# Patient Record
Sex: Female | Born: 1995 | Hispanic: Yes | Marital: Single | Smoking: Never smoker
Health system: Southern US, Community
[De-identification: ages and names within clinical notes are randomized; demographics above are authoritative.]

## PROBLEM LIST (undated history)

## (undated) DIAGNOSIS — D649 Anemia, unspecified: Secondary | ICD-10-CM

---
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
## 2016-01-06 ENCOUNTER — Ambulatory Visit (INDEPENDENT_AMBULATORY_CARE_PROVIDER_SITE_OTHER): Payer: Medicaid Other

## 2016-01-06 DIAGNOSIS — Z3402 Encounter for supervision of normal first pregnancy, second trimester: Secondary | ICD-10-CM | POA: Diagnosis not present

## 2016-01-08 ENCOUNTER — Other Ambulatory Visit: Payer: Self-pay | Admitting: Obstetrics and Gynecology

## 2016-01-11 ENCOUNTER — Encounter: Payer: Medicaid Other | Admitting: Obstetrics and Gynecology

## 2016-01-19 ENCOUNTER — Ambulatory Visit (INDEPENDENT_AMBULATORY_CARE_PROVIDER_SITE_OTHER): Payer: Medicaid Other | Admitting: Obstetrics and Gynecology

## 2016-01-19 VITALS — BP 105/67 | HR 96 | Wt 157.6 lb

## 2016-01-19 DIAGNOSIS — Z3492 Encounter for supervision of normal pregnancy, unspecified, second trimester: Secondary | ICD-10-CM

## 2016-01-19 LAB — POCT URINALYSIS DIPSTICK
Bilirubin, UA: NEGATIVE
Glucose, UA: NEGATIVE
Ketones, UA: NEGATIVE
Leukocytes, UA: NEGATIVE
NITRITE UA: NEGATIVE
PH UA: 6.5
PROTEIN UA: NEGATIVE
RBC UA: NEGATIVE
Spec Grav, UA: 1.015
UROBILINOGEN UA: 0.2

## 2016-01-19 NOTE — Progress Notes (Signed)
ROB-doing well, next visit glucola, encouraged enrolling in classes-schedule given

## 2016-01-19 NOTE — Progress Notes (Signed)
ROB- pt is doing well, denies any complaints 

## 2016-02-08 ENCOUNTER — Other Ambulatory Visit: Payer: Self-pay | Admitting: Obstetrics and Gynecology

## 2016-02-16 ENCOUNTER — Other Ambulatory Visit: Payer: Self-pay | Admitting: Obstetrics and Gynecology

## 2016-02-16 DIAGNOSIS — O219 Vomiting of pregnancy, unspecified: Secondary | ICD-10-CM

## 2016-02-21 ENCOUNTER — Other Ambulatory Visit: Payer: Medicaid Other

## 2016-02-21 ENCOUNTER — Ambulatory Visit (INDEPENDENT_AMBULATORY_CARE_PROVIDER_SITE_OTHER): Payer: Medicaid Other | Admitting: Obstetrics and Gynecology

## 2016-02-21 VITALS — BP 101/65 | HR 97 | Wt 169.3 lb

## 2016-02-21 DIAGNOSIS — Z3403 Encounter for supervision of normal first pregnancy, third trimester: Secondary | ICD-10-CM

## 2016-02-21 DIAGNOSIS — Z23 Encounter for immunization: Secondary | ICD-10-CM

## 2016-02-21 DIAGNOSIS — Z131 Encounter for screening for diabetes mellitus: Secondary | ICD-10-CM

## 2016-02-21 LAB — POCT URINALYSIS DIPSTICK
BILIRUBIN UA: NEGATIVE
Blood, UA: NEGATIVE
GLUCOSE UA: NEGATIVE
KETONES UA: NEGATIVE
Nitrite, UA: NEGATIVE
PROTEIN UA: NEGATIVE
SPEC GRAV UA: 1.02
Urobilinogen, UA: NEGATIVE
pH, UA: 6.5

## 2016-02-21 MED ORDER — TETANUS-DIPHTH-ACELL PERTUSSIS 5-2.5-18.5 LF-MCG/0.5 IM SUSP
0.5000 mL | Freq: Once | INTRAMUSCULAR | Status: AC
  Administered 2016-02-21: 0.5 mL via INTRAMUSCULAR

## 2016-02-21 NOTE — Progress Notes (Signed)
ROB: Doing well, denies complaints.  For 28 week labs today.  Desires to bottle feed.  Discussed benefits of breastfeeding. Unsure of contraceptive desires. For Tdap today, signed blood consent, discussed cord blood banking. Declines circumcision for female infant. Discussed pediatrician options for patient.

## 2016-02-22 LAB — HEMOGLOBIN AND HEMATOCRIT, BLOOD
HEMATOCRIT: 30.5 % — AB (ref 34.0–46.6)
Hemoglobin: 10.4 g/dL — ABNORMAL LOW (ref 11.1–15.9)

## 2016-02-22 LAB — GLUCOSE, 1 HOUR GESTATIONAL: Gestational Diabetes Screen: 100 mg/dL (ref 65–139)

## 2016-02-27 ENCOUNTER — Other Ambulatory Visit: Payer: Self-pay | Admitting: Obstetrics and Gynecology

## 2016-02-27 DIAGNOSIS — O219 Vomiting of pregnancy, unspecified: Secondary | ICD-10-CM

## 2016-03-06 ENCOUNTER — Ambulatory Visit (INDEPENDENT_AMBULATORY_CARE_PROVIDER_SITE_OTHER): Payer: Medicaid Other | Admitting: Obstetrics and Gynecology

## 2016-03-06 VITALS — BP 98/61 | HR 103 | Wt 172.2 lb

## 2016-03-06 DIAGNOSIS — O99013 Anemia complicating pregnancy, third trimester: Secondary | ICD-10-CM

## 2016-03-06 DIAGNOSIS — Z3403 Encounter for supervision of normal first pregnancy, third trimester: Secondary | ICD-10-CM

## 2016-03-06 LAB — POCT URINALYSIS DIPSTICK
Bilirubin, UA: NEGATIVE
Blood, UA: NEGATIVE
Glucose, UA: NEGATIVE
KETONES UA: NEGATIVE
Nitrite, UA: NEGATIVE
PH UA: 8.5
PROTEIN UA: NEGATIVE
SPEC GRAV UA: 1.01
UROBILINOGEN UA: 1

## 2016-03-06 MED ORDER — FERROUS SULFATE 325 (65 FE) MG PO TABS: 325.0000 mg | ORAL_TABLET | Freq: Every day | ORAL | 1 refills | Status: DC

## 2016-03-06 MED ORDER — DOCUSATE SODIUM 100 MG PO CAPS: 100.0000 mg | ORAL_CAPSULE | Freq: Two times a day (BID) | ORAL | 2 refills | Status: DC | PRN

## 2016-03-06 NOTE — Progress Notes (Signed)
ROB: Patient denies complaints. Normal glucola screen, mild anemia noted on labs.  Prescribed daily iron supplement, also prescribed Colace prn for constipation. Flu vaccine given last visit. RTC in 2 weeks.

## 2016-03-06 NOTE — Progress Notes (Signed)
ROB: Doing well, denies complaints. Desires OCPs for contraception.  Flu vaccine given last visit. Borderline anemia noted on 28 week labs, prescribed daily iron supplement.  Also prescribed Colace for mild constipation.  RTC in 2 weeks.

## 2016-03-20 ENCOUNTER — Ambulatory Visit (INDEPENDENT_AMBULATORY_CARE_PROVIDER_SITE_OTHER): Payer: Medicaid Other | Admitting: Obstetrics and Gynecology

## 2016-03-20 VITALS — BP 105/66 | HR 106 | Wt 176.9 lb

## 2016-03-20 DIAGNOSIS — Z3403 Encounter for supervision of normal first pregnancy, third trimester: Secondary | ICD-10-CM

## 2016-03-20 DIAGNOSIS — O2603 Excessive weight gain in pregnancy, third trimester: Secondary | ICD-10-CM

## 2016-03-20 DIAGNOSIS — O219 Vomiting of pregnancy, unspecified: Secondary | ICD-10-CM

## 2016-03-20 LAB — POCT URINALYSIS DIPSTICK
Bilirubin, UA: NEGATIVE
Glucose, UA: NEGATIVE
Ketones, UA: NEGATIVE
NITRITE UA: NEGATIVE
Protein, UA: NEGATIVE
RBC UA: NEGATIVE
SPEC GRAV UA: 1.015
UROBILINOGEN UA: NEGATIVE
pH, UA: 8

## 2016-03-20 MED ORDER — ONDANSETRON 4 MG PO TBDP: 4.0000 mg | ORAL_TABLET | Freq: Four times a day (QID) | ORAL | 1 refills | Status: DC | PRN

## 2016-03-20 NOTE — Progress Notes (Signed)
ROB: Patient still notes daily nausea and vomiting, desires refill on Zofran.  Discussed TWG (46 lbs), monitoring diet, calorie control. RTC in 2 weeks.

## 2016-04-03 ENCOUNTER — Ambulatory Visit (INDEPENDENT_AMBULATORY_CARE_PROVIDER_SITE_OTHER): Payer: Medicaid Other | Admitting: Obstetrics and Gynecology

## 2016-04-03 VITALS — BP 112/71 | HR 98 | Wt 179.8 lb

## 2016-04-03 DIAGNOSIS — Z3403 Encounter for supervision of normal first pregnancy, third trimester: Secondary | ICD-10-CM

## 2016-04-03 DIAGNOSIS — O2603 Excessive weight gain in pregnancy, third trimester: Secondary | ICD-10-CM

## 2016-04-03 LAB — POCT URINALYSIS DIPSTICK
Bilirubin, UA: NEGATIVE
Blood, UA: NEGATIVE
GLUCOSE UA: NEGATIVE
KETONES UA: NEGATIVE
Nitrite, UA: NEGATIVE
SPEC GRAV UA: 1.025
Urobilinogen, UA: NEGATIVE
pH, UA: 6

## 2016-04-03 NOTE — Progress Notes (Signed)
ROB: Denies complaints. Reiterated TWG (49 lbs), monitoring diet, calorie control. RTC in 2 weeks.

## 2016-04-17 ENCOUNTER — Ambulatory Visit (INDEPENDENT_AMBULATORY_CARE_PROVIDER_SITE_OTHER): Payer: Medicaid Other | Admitting: Obstetrics and Gynecology

## 2016-04-17 VITALS — BP 97/63 | HR 94 | Wt 184.8 lb

## 2016-04-17 DIAGNOSIS — Z3685 Encounter for antenatal screening for Streptococcus B: Secondary | ICD-10-CM

## 2016-04-17 DIAGNOSIS — O99013 Anemia complicating pregnancy, third trimester: Secondary | ICD-10-CM

## 2016-04-17 DIAGNOSIS — Z3403 Encounter for supervision of normal first pregnancy, third trimester: Secondary | ICD-10-CM

## 2016-04-17 DIAGNOSIS — Z113 Encounter for screening for infections with a predominantly sexual mode of transmission: Secondary | ICD-10-CM

## 2016-04-17 DIAGNOSIS — O2603 Excessive weight gain in pregnancy, third trimester: Secondary | ICD-10-CM

## 2016-04-17 LAB — POCT URINALYSIS DIPSTICK
BILIRUBIN UA: NEGATIVE
Glucose, UA: NEGATIVE
KETONES UA: NEGATIVE
Nitrite, UA: NEGATIVE
PH UA: 7
RBC UA: NEGATIVE
SPEC GRAV UA: 1.02
Urobilinogen, UA: NEGATIVE

## 2016-04-17 LAB — OB RESULTS CONSOLE GBS: GBS: NEGATIVE

## 2016-04-17 NOTE — Patient Instructions (Signed)
Labor precautions reviewed.  Call office or follow up at Labor and Delivery for the following: 1) Contractions less than 10 min apart for longer than 1 hour 2) Rupture of membranes 3) Vaginal bleeding requiring a pad 4) Decreased fetal movement 

## 2016-04-17 NOTE — Progress Notes (Signed)
ROB: Denies complaints. 36 week labs done today.  Discussed labor precautions. Reiterated weight gain precautions. RTC in 1 week.

## 2016-04-19 LAB — GC/CHLAMYDIA PROBE AMP
Chlamydia trachomatis, NAA: NEGATIVE
NEISSERIA GONORRHOEAE BY PCR: NEGATIVE

## 2016-04-21 LAB — CULTURE, BETA STREP (GROUP B ONLY): STREP GP B CULTURE: NEGATIVE

## 2016-04-26 ENCOUNTER — Ambulatory Visit (INDEPENDENT_AMBULATORY_CARE_PROVIDER_SITE_OTHER): Payer: Medicaid Other | Admitting: Obstetrics and Gynecology

## 2016-04-26 ENCOUNTER — Encounter: Payer: Medicaid Other | Admitting: Obstetrics and Gynecology

## 2016-04-26 VITALS — BP 115/69 | HR 109 | Wt 187.4 lb

## 2016-04-26 DIAGNOSIS — M791 Myalgia: Secondary | ICD-10-CM

## 2016-04-26 DIAGNOSIS — Z3403 Encounter for supervision of normal first pregnancy, third trimester: Secondary | ICD-10-CM

## 2016-04-26 DIAGNOSIS — M7918 Myalgia, other site: Secondary | ICD-10-CM

## 2016-04-26 LAB — POCT URINALYSIS DIPSTICK
Bilirubin, UA: NEGATIVE
GLUCOSE UA: NEGATIVE
Ketones, UA: NEGATIVE
Nitrite, UA: NEGATIVE
PH UA: 8
Spec Grav, UA: 1.01
UROBILINOGEN UA: NEGATIVE

## 2016-04-26 NOTE — Progress Notes (Signed)
ROB: C/o abdominal pain in upper left quadrant, musculoskeletal, likely where most fetal kicks felt.  Given reassurance, can use warm compress or Tylenol prn. Declines cervical exam today. GBS neg. RTC in 1 week.

## 2016-05-02 ENCOUNTER — Ambulatory Visit (INDEPENDENT_AMBULATORY_CARE_PROVIDER_SITE_OTHER): Payer: Medicaid Other | Admitting: Obstetrics and Gynecology

## 2016-05-02 VITALS — BP 94/67 | HR 117 | Wt 188.5 lb

## 2016-05-02 DIAGNOSIS — Z3493 Encounter for supervision of normal pregnancy, unspecified, third trimester: Secondary | ICD-10-CM

## 2016-05-02 LAB — POCT URINALYSIS DIPSTICK
Glucose, UA: NEGATIVE
KETONES UA: NEGATIVE
Nitrite, UA: NEGATIVE
PH UA: 7
RBC UA: NEGATIVE
SPEC GRAV UA: 1.015
Urobilinogen, UA: 0.2

## 2016-05-02 NOTE — Progress Notes (Signed)
ROB- pt is doing well, denies any complaints 

## 2016-05-02 NOTE — Progress Notes (Signed)
ROB- doing well with no complaints, labor precautions discussed

## 2016-05-09 ENCOUNTER — Ambulatory Visit (INDEPENDENT_AMBULATORY_CARE_PROVIDER_SITE_OTHER): Payer: Medicaid Other | Admitting: Obstetrics and Gynecology

## 2016-05-09 VITALS — BP 109/68 | HR 102 | Wt 191.0 lb

## 2016-05-09 DIAGNOSIS — R42 Dizziness and giddiness: Secondary | ICD-10-CM

## 2016-05-09 DIAGNOSIS — O2603 Excessive weight gain in pregnancy, third trimester: Secondary | ICD-10-CM

## 2016-05-09 DIAGNOSIS — O99013 Anemia complicating pregnancy, third trimester: Secondary | ICD-10-CM

## 2016-05-09 DIAGNOSIS — Z3403 Encounter for supervision of normal first pregnancy, third trimester: Secondary | ICD-10-CM

## 2016-05-09 LAB — POCT URINALYSIS DIPSTICK
BILIRUBIN UA: NEGATIVE
GLUCOSE UA: NEGATIVE
Ketones, UA: NEGATIVE
NITRITE UA: NEGATIVE
RBC UA: NEGATIVE
Spec Grav, UA: 1.015
UROBILINOGEN UA: NEGATIVE
pH, UA: 7.5

## 2016-05-09 NOTE — Progress Notes (Signed)
ROB: Patient notes intermittent ctx, not strong. Labor precautions reiterated. Also c/o dizziness over past few days, reports adequate hydration. BPs wnl, pulse mildly elevated. Has h/o anemia in pregnancy (mild), taking iron.  Will recheck Hgb.  RTC in 1 week if undelivered.

## 2016-05-16 ENCOUNTER — Ambulatory Visit (INDEPENDENT_AMBULATORY_CARE_PROVIDER_SITE_OTHER): Payer: Medicaid Other | Admitting: Obstetrics and Gynecology

## 2016-05-16 ENCOUNTER — Other Ambulatory Visit: Payer: Self-pay | Admitting: Obstetrics and Gynecology

## 2016-05-16 VITALS — BP 111/63 | HR 101 | Wt 192.2 lb

## 2016-05-16 DIAGNOSIS — Z3403 Encounter for supervision of normal first pregnancy, third trimester: Secondary | ICD-10-CM

## 2016-05-16 DIAGNOSIS — O2600 Excessive weight gain in pregnancy, unspecified trimester: Secondary | ICD-10-CM

## 2016-05-16 LAB — POCT URINALYSIS DIPSTICK
BILIRUBIN UA: NEGATIVE
GLUCOSE UA: NEGATIVE
Ketones, UA: NEGATIVE
NITRITE UA: NEGATIVE
Spec Grav, UA: 1.015
Urobilinogen, UA: NEGATIVE
pH, UA: 6.5

## 2016-05-16 NOTE — Progress Notes (Signed)
ROB: Doing well, no complaints. Still notes irregular mild ctx.  Desires IOL for post-dates.Discussed risks vs benefits.  Scheduled for 05/18/16 (to come in evening prior for cervical ripening).

## 2016-05-16 NOTE — Patient Instructions (Addendum)
Labor Induction Labor induction is when steps are taken to cause a pregnant woman to begin the labor process. Most women go into labor on their own between 37 weeks and 42 weeks of the pregnancy. When this does not happen or when there is a medical need, methods may be used to induce labor. Labor induction causes a pregnant woman's uterus to contract. It also causes the cervix to soften (ripen), open (dilate), and thin out (efface). Usually, labor is not induced before 39 weeks of the pregnancy unless there is a problem with the baby or mother. Before inducing labor, your health care provider will consider a number of factors, including the following:  The medical condition of you and the baby.  How many weeks along you are.  The status of the baby's lung maturity.  The condition of the cervix.  The position of the baby. What are the reasons for labor induction? Labor may be induced for the following reasons:  The health of the baby or mother is at risk.  The pregnancy is overdue by 1 week or more.  The water breaks but labor does not start on its own.  The mother has a health condition or serious illness, such as high blood pressure, infection, placental abruption, or diabetes.  The amniotic fluid amounts are low around the baby.  The baby is distressed. Convenience or wanting the baby to be born on a certain date is not a reason for inducing labor. What methods are used for labor induction? Several methods of labor induction may be used, such as:  Prostaglandin medicine. This medicine causes the cervix to dilate and ripen. The medicine will also start contractions. It can be taken by mouth or by inserting a suppository into the vagina.  Inserting a thin tube (catheter) with a balloon on the end into the vagina to dilate the cervix. Once inserted, the balloon is expanded with water, which causes the cervix to open.  Stripping the membranes. Your health care provider separates  amniotic sac tissue from the cervix, causing the cervix to be stretched and causing the release of a hormone called progesterone. This may cause the uterus to contract. It is often done during an office visit. You will be sent home to wait for the contractions to begin. You will then come in for an induction.  Breaking the water. Your health care provider makes a hole in the amniotic sac using a small instrument. Once the amniotic sac breaks, contractions should begin. This may still take hours to see an effect.  Medicine to trigger or strengthen contractions. This medicine is given through an IV access tube inserted into a vein in your arm. All of the methods of induction, besides stripping the membranes, will be done in the hospital. Induction is done in the hospital so that you and the baby can be carefully monitored. How long does it take for labor to be induced? Some inductions can take up to 2-3 days. Depending on the cervix, it usually takes less time. It takes longer when you are induced early in the pregnancy or if this is your first pregnancy. If a mother is still pregnant and the induction has been going on for 2-3 days, either the mother will be sent home or a cesarean delivery will be needed. What are the risks associated with labor induction? Some of the risks of induction include:  Changes in fetal heart rate, such as too high, too low, or erratic.  Fetal distress.    Chance of infection for the mother and baby.  Increased chance of having a cesarean delivery.  Breaking off (abruption) of the placenta from the uterus (rare).  Uterine rupture (very rare). When induction is needed for medical reasons, the benefits of induction may outweigh the risks. What are some reasons for not inducing labor? Labor induction should not be done if:  It is shown that your baby does not tolerate labor.  You have had previous surgeries on your uterus, such as a myomectomy or the removal of  fibroids.  Your placenta lies very low in the uterus and blocks the opening of the cervix (placenta previa).  Your baby is not in a head-down position.  The umbilical cord drops down into the birth canal in front of the baby. This could cut off the baby's blood and oxygen supply.  You have had a previous cesarean delivery.  There are unusual circumstances, such as the baby being extremely premature. This information is not intended to replace advice given to you by your health care provider. Make sure you discuss any questions you have with your health care provider. Document Released: 09/26/2006 Document Revised: 10/13/2015 Document Reviewed: 12/04/2012 Elsevier Interactive Patient Education  2017 [employer] Inc.  Pain Relief During Labor and Delivery Many things can cause pain during labor and delivery, including:  Pressure on bones and ligaments due to the baby moving through the pelvis.  Stretching of tissues due to the baby moving through the birth canal.  Muscle tension due to anxiety or nervousness.  The uterus tightening (contracting) and relaxing to help move the baby. There are many ways to deal with the pain of labor and delivery. They include:  Taking prenatal classes. Taking these classes helps you know what to expect during your baby's birth. What you learn will increase your confidence and decrease your anxiety.  Practicing relaxation techniques or doing relaxing activities, such as:  Focused breathing.  Meditation.  Visualization.  Aroma therapy.  Listening to your favorite music.  Hypnosis.  Taking a warm shower or bath (hydrotherapy). This may:  Provide comfort and relaxation.  Lessen your perception of pain.  Decrease the amount of pain medicine needed.  Decrease the length of labor.  Getting a massage or counterpressure on your back.  Applying warm packs or ice packs.  Changing positions often, moving around, or using a birthing  ball.  Getting:  Pain medicine through an IV or injection into a muscle.  Pain medicine inserted into your spinal column.  Injections of sterile water just under the skin on your lower back (intradermal injections).  Laughing gas (nitrous oxide). Discuss your pain control options with your health care provider during your prenatal visits. Explore the options offered by your hospital or birth center. What kinds of medicine are available? There are two kinds of medicines that can be used to relieve pain during labor and delivery:  Analgesics. These medicines decrease pain without causing you to lose feeling or the ability to move your muscles.  Anesthetics. These medicines block feeling in the body and can decrease your ability to move freely. Both of these kinds of medicine can cause minor side effects, such as nausea, trouble concentrating, and sleepiness. They can also decrease the baby's heart rate before birth and affect the baby's breathing rate after birth. For this reason, health care providers are careful about when and how much medicine is given. What are specific medicines and procedures that provide pain relief?  Local Anesthetics  Local anesthetics  are used to numb a small area of the body. They may be used along with another kind of anesthetic or used to numb the nerves of the vagina, cervix, and perineum during the second stage of labor. General Anesthetics  General anesthetics cause you to lose consciousness so you do not feel pain. They are usually only used for an emergency cesarean delivery. General anesthetics are given through an IV tube and a mask. Pudendal Block  A pudendal block is a form of local anesthetic. It may be used to relieve the pain associated with pushing or stretching of the perineum at the time of delivery or to further numb the perineum. A pudendal block is done by injecting numbing medicine through the vaginal wall into a nerve in the pelvis. Epidural  Analgesia  Epidural analgesia is given through a flexible IV catheter that is inserted into the lower back. Numbing medicine is delivered continuously to the area near your spinal column nerves (epidural space). After having this type of analgesia, you may be able to move your legs but you most likely will not be able to walk. Depending on the amount of medicine given, you may lose all feeling in the lower half of your body, or you may retain some level of sensation, including the urge to push. Epidural analgesia can be used to provide pain relief for a vaginal birth. Spinal Block  A spinal block is similar to epidural analgesia, but the medicine is injected into the spinal fluid instead of the epidural space. A spinal block is only given once. It starts to relieve pain quickly, but the pain relief lasts only 1-6 hours. Spinal blocks can be used for cesarean deliveries. Combined Spinal-Epidural (CSE) Block  A CSE block combines the effects of a spinal block and epidural analgesia. The spinal block works quickly to block all pain. The epidural analgesia provides continuous pain relief, even after the effects of the spinal block have worn off. This information is not intended to replace advice given to you by your health care provider. Make sure you discuss any questions you have with your health care provider. Document Released: 08/23/2008 Document Revised: 10/14/2015 Document Reviewed: 09/28/2015 Elsevier Interactive Patient Education  2017 [employer]

## 2016-05-17 ENCOUNTER — Telehealth: Payer: Self-pay | Admitting: Obstetrics and Gynecology

## 2016-05-17 NOTE — Telephone Encounter (Signed)
Contacted patient, left voicemail regarding rescheduling of elective induction.  Patient currently scheduled for this evening at 9 pm, however will need to be moved to tomorrow morning at 8 am due to staffing and full Labor and Delivery.  Advised that this was tentative, and that this also may change.  Patient instructed to call the office if she has any questions.    Hildred LaserAnika Sania Noy, MD [facility]

## 2016-05-18 ENCOUNTER — Inpatient Hospital Stay
Admission: EM | Admit: 2016-05-18 | Discharge: 2016-05-20 | DRG: 775 | Disposition: A | Discharge status: Home / Self Care | Payer: Medicaid Other | Attending: Obstetrics and Gynecology | Admitting: Obstetrics and Gynecology

## 2016-05-18 ENCOUNTER — Other Ambulatory Visit: Payer: Self-pay | Admitting: Obstetrics and Gynecology

## 2016-05-18 ENCOUNTER — Ambulatory Visit (INDEPENDENT_AMBULATORY_CARE_PROVIDER_SITE_OTHER): Payer: Medicaid Other | Admitting: Obstetrics and Gynecology

## 2016-05-18 ENCOUNTER — Ambulatory Visit (INDEPENDENT_AMBULATORY_CARE_PROVIDER_SITE_OTHER): Payer: Medicaid Other

## 2016-05-18 ENCOUNTER — Encounter: Payer: Self-pay | Admitting: Anesthesiology

## 2016-05-18 VITALS — BP 131/64 | HR 110 | Wt 193.7 lb

## 2016-05-18 DIAGNOSIS — O9081 Anemia of the puerperium: Secondary | ICD-10-CM | POA: Diagnosis not present

## 2016-05-18 DIAGNOSIS — D649 Anemia, unspecified: Secondary | ICD-10-CM | POA: Diagnosis not present

## 2016-05-18 DIAGNOSIS — D72829 Elevated white blood cell count, unspecified: Secondary | ICD-10-CM | POA: Diagnosis not present

## 2016-05-18 DIAGNOSIS — Z3A40 40 weeks gestation of pregnancy: Secondary | ICD-10-CM

## 2016-05-18 DIAGNOSIS — Z833 Family history of diabetes mellitus: Secondary | ICD-10-CM | POA: Diagnosis not present

## 2016-05-18 DIAGNOSIS — O99013 Anemia complicating pregnancy, third trimester: Secondary | ICD-10-CM

## 2016-05-18 DIAGNOSIS — O4202 Full-term premature rupture of membranes, onset of labor within 24 hours of rupture: Secondary | ICD-10-CM | POA: Diagnosis present

## 2016-05-18 DIAGNOSIS — O480 Post-term pregnancy: Secondary | ICD-10-CM

## 2016-05-18 DIAGNOSIS — O479 False labor, unspecified: Secondary | ICD-10-CM | POA: Diagnosis present

## 2016-05-18 DIAGNOSIS — Z3403 Encounter for supervision of normal first pregnancy, third trimester: Secondary | ICD-10-CM | POA: Diagnosis not present

## 2016-05-18 LAB — POCT URINALYSIS DIPSTICK
Bilirubin, UA: NEGATIVE
GLUCOSE UA: NEGATIVE
KETONES UA: NEGATIVE
Leukocytes, UA: NEGATIVE
Nitrite, UA: NEGATIVE
UROBILINOGEN UA: NEGATIVE
pH, UA: 6

## 2016-05-18 LAB — TYPE AND SCREEN
ABO/RH(D): O POS
Antibody Screen: NEGATIVE

## 2016-05-18 LAB — CBC
HCT: 33.7 % — ABNORMAL LOW (ref 35.0–47.0)
Hemoglobin: 11.1 g/dL — ABNORMAL LOW (ref 12.0–16.0)
MCH: 29 pg (ref 26.0–34.0)
MCHC: 32.9 g/dL (ref 32.0–36.0)
MCV: 88.2 fL (ref 80.0–100.0)
PLATELETS: 238 10*3/uL (ref 150–440)
RBC: 3.82 MIL/uL (ref 3.80–5.20)
RDW: 16.6 % — AB (ref 11.5–14.5)
WBC: 13.5 10*3/uL — ABNORMAL HIGH (ref 3.6–11.0)

## 2016-05-18 MED ORDER — LIDOCAINE HCL (PF) 1 % IJ SOLN
30.0000 mL | INTRAMUSCULAR | Status: DC | PRN
  Administered 2016-05-19: 30 mL via SUBCUTANEOUS

## 2016-05-18 MED ORDER — BUTORPHANOL TARTRATE 1 MG/ML IJ SOLN
1.0000 mg | INTRAMUSCULAR | Status: DC | PRN
  Administered 2016-05-18 (×2): 1 mg via INTRAVENOUS
  Filled 2016-05-18 (×2): qty 1

## 2016-05-18 MED ORDER — FENTANYL 2.5 MCG/ML W/ROPIVACAINE 0.2% IN NS 100 ML EPIDURAL INFUSION (ARMC-ANES)
EPIDURAL | Status: AC
  Filled 2016-05-18: qty 100

## 2016-05-18 MED ORDER — LIDOCAINE HCL (PF) 1 % IJ SOLN
INTRAMUSCULAR | Status: AC
  Administered 2016-05-19: 30 mL via SUBCUTANEOUS
  Filled 2016-05-18: qty 30

## 2016-05-18 MED ORDER — LACTATED RINGERS IV SOLN: 500.0000 mL | INTRAVENOUS | Status: DC | PRN

## 2016-05-18 MED ORDER — ONDANSETRON HCL 4 MG/2ML IJ SOLN: 4.0000 mg | Freq: Four times a day (QID) | INTRAMUSCULAR | Status: DC | PRN

## 2016-05-18 MED ORDER — AMMONIA AROMATIC IN INHA
RESPIRATORY_TRACT | Status: AC
  Filled 2016-05-18: qty 10

## 2016-05-18 MED ORDER — OXYCODONE-ACETAMINOPHEN 5-325 MG PO TABS
2.0000 | ORAL_TABLET | ORAL | Status: DC | PRN
  Administered 2016-05-19: 2 via ORAL
  Filled 2016-05-18: qty 2

## 2016-05-18 MED ORDER — BUTORPHANOL TARTRATE 1 MG/ML IJ SOLN: 2.0000 mg | INTRAMUSCULAR | Status: DC | PRN

## 2016-05-18 MED ORDER — OXYTOCIN 40 UNITS IN LACTATED RINGERS INFUSION - SIMPLE MED
2.5000 [IU]/h | INTRAVENOUS | Status: DC
  Filled 2016-05-18: qty 1000

## 2016-05-18 MED ORDER — OXYCODONE-ACETAMINOPHEN 5-325 MG PO TABS: 1.0000 | ORAL_TABLET | ORAL | Status: DC | PRN

## 2016-05-18 MED ORDER — LACTATED RINGERS IV SOLN
INTRAVENOUS | Status: DC
  Administered 2016-05-18: 21:00:00 via INTRAVENOUS

## 2016-05-18 MED ORDER — ACETAMINOPHEN 325 MG PO TABS: 650.0000 mg | ORAL_TABLET | ORAL | Status: DC | PRN

## 2016-05-18 MED ORDER — MISOPROSTOL 200 MCG PO TABS
ORAL_TABLET | ORAL | Status: AC
  Filled 2016-05-18: qty 4

## 2016-05-18 MED ORDER — SOD CITRATE-CITRIC ACID 500-334 MG/5ML PO SOLN: 30.0000 mL | ORAL | Status: DC | PRN

## 2016-05-18 MED ORDER — OXYTOCIN 40 UNITS IN LACTATED RINGERS INFUSION - SIMPLE MED: 1.0000 m[IU]/min | INTRAVENOUS | Status: DC

## 2016-05-18 MED ORDER — TERBUTALINE SULFATE 1 MG/ML IJ SOLN: 0.2500 mg | Freq: Once | INTRAMUSCULAR | Status: DC | PRN

## 2016-05-18 MED ORDER — OXYTOCIN 10 UNIT/ML IJ SOLN
INTRAMUSCULAR | Status: AC
  Administered 2016-05-18: 20 [IU]
  Filled 2016-05-18: qty 2

## 2016-05-18 MED ORDER — MISOPROSTOL 25 MCG QUARTER TABLET: 25.0000 ug | ORAL_TABLET | ORAL | Status: DC | PRN

## 2016-05-18 MED ORDER — OXYTOCIN BOLUS FROM INFUSION: 500.0000 mL | Freq: Once | INTRAVENOUS | Status: DC

## 2016-05-18 NOTE — H&P (Signed)
Obstetric History and Physical  April Norris is a 20 y.o. G1P0 with IUP at 6872w4d presenting for ruptured membranes.  Patient states she has been having  regular, every 3-4 minutes contractions, none vaginal bleeding, ruptured, clear fluid membranes since 8:00 p.m., with active fetal movement.    Of note, patient was scheduled for elective IOL this evening for post-dates pregnancy.   Prenatal Course Source of Care: [facility] with onset of care at 8 weeks Pregnancy complications or risks: Patient Active Problem List   Diagnosis Date Noted  . Uterine contractions during pregnancy 05/18/2016  . Excessive weight gain during pregnancy in third trimester 03/20/2016  . Nausea and vomiting during pregnancy 10/26/2015  . Supervision of normal first teen pregnancy, third trimester 10/26/2015   She plans to bottle feed She desires oral contraceptives (estrogen/progesterone) for postpartum contraception.   Prenatal labs and studies: ABO, Rh: --/--/O POS (12/29 2059) Antibody: NEG (12/29 2059) Rubella: 1.42 (06/09 1108) RPR: Non Reactive (05/18 1027)  HBsAg: Negative (05/18 1027)  HIV: Non Reactive (05/18 1027)  ZOX:WRUEAVWUGBS:Negative (11/28 1020) 1 hr Glucola  Normal (100) Genetic screening normal Anatomy US normal   Past Medical History:  Diagnosis Date  . Anemia     Past Surgical History:  Procedure Laterality Date  . GANGLION CYST EXCISION Left 10/11/2014   Procedure: REMOVAL GANGLION OF WRIST;  Surgeon: Deeann SaintHoward Miller, MD;  Location: [facility] ORS;  Service: Orthopedics;  Laterality: Left;    OB History  Gravida Para Term Preterm AB Living  1            SAB TAB Ectopic Multiple Live Births               # Outcome Date GA Lbr Len/2nd Weight Sex Delivery Anes PTL Lv  1 Current               Social History   Social History  . Marital status: Single    Spouse name: N/A  . Number of children: N/A  . Years of education: N/A   Occupational History  . student    Social  History Main Topics  . Smoking status: Never Smoker  . Smokeless tobacco: Never Used  . Alcohol use No  . Drug use: No  . Sexual activity: Yes    Partners: Male   Other Topics Concern  . None   Social History Narrative  . None    Family History  Problem Relation Age of Onset  . Diabetes Maternal Grandmother   . Hyperlipidemia Maternal Grandmother   . Diabetes Paternal Grandmother   . Cancer Paternal Grandfather     ?stomach related    Prescriptions Prior to Admission  Medication Sig Dispense Refill Last Dose  . ferrous sulfate (FERROUSUL) 325 (65 FE) MG tablet Take 1 tablet (325 mg total) by mouth daily with breakfast. 60 tablet 1 05/17/2016 at Unknown time  . Prenatal Vit-Fe Fumarate-FA (PRENATAL MULTIVITAMIN) TABS tablet Take 1 tablet by mouth daily at 12 noon.   05/17/2016 at Unknown time    No Known Allergies  Review of Systems: Negative except for what is mentioned in HPI.  Physical Exam: BP 116/77 (BP Location: Left Arm)   Pulse 99   Temp 98.3 F (36.8 C) (Oral)   Resp 18   Ht 5\' 3"  (1.6 m)   Wt 193 lb (87.5 kg)   LMP 08/08/2015 (Exact Date)   BMI 34.19 kg/m  CONSTITUTIONAL: Well-developed, well-nourished female in no mild to moderate distress with  contractions.  HENT:  Normocephalic, atraumatic, External right and left ear normal. Oropharynx is clear and moist EYES: Conjunctivae and EOM are normal. Pupils are equal, round, and reactive to light. No scleral icterus.  NECK: Normal range of motion, supple, no masses SKIN: Skin is warm and dry. No rash noted. Not diaphoretic. No erythema. No pallor. NEUROLOGIC: Alert and oriented to person, place, and time. Normal reflexes, muscle tone coordination. No cranial nerve deficit noted. PSYCHIATRIC: Normal mood and affect. Normal behavior. Normal judgment and thought content. CARDIOVASCULAR: Normal heart rate noted, regular rhythm RESPIRATORY: Effort and breath sounds normal, no problems with respiration  noted ABDOMEN: Soft, nontender, nondistended, gravid. MUSCULOSKELETAL: Normal range of motion. No edema and no tenderness. 2+ distal pulses.  Cervical Exam: Dilatation 3 cm   Effacement 80%   Station -1   Presentation: cephalic FHT:  Baseline rate 148 bpm   Variability moderate  Accelerations present   Decelerations none Contractions: Every 3 mins   Pertinent Labs/Studies:   Results for orders placed or performed during the hospital encounter of 05/18/16 (from the past 24 hour(s))  CBC     Status: Abnormal   Collection Time: 05/18/16  8:59 PM  Result Value Ref Range   WBC 13.5 (H) 3.6 - 11.0 K/uL   RBC 3.82 3.80 - 5.20 MIL/uL   Hemoglobin 11.1 (L) 12.0 - 16.0 g/dL   HCT 40.933.7 (L) 81.135.0 - 91.447.0 %   MCV 88.2 80.0 - 100.0 fL   MCH 29.0 26.0 - 34.0 pg   MCHC 32.9 32.0 - 36.0 g/dL   RDW 78.216.6 (H) 95.611.5 - 21.314.5 %   Platelets 238 150 - 440 K/uL  Type and screen     Status: None   Collection Time: 05/18/16  8:59 PM  Result Value Ref Range   ABO/RH(D) O POS    Antibody Screen NEG    Sample Expiration 05/21/2016     Assessment : April Norris is a 20 y.o. G1P0 at 4470w4d being admitted for early labor with SROM.  Plan: Labor: Expectant management. Augmentation as needed, per protocol.  Desires to labor with IV meds only at this time.  FWB: Reassuring fetal heart tracing.  GBS negative Delivery plan: Hopeful for vaginal delivery.  Hildred LaserAnika Soledad Budreau, MD [facility]

## 2016-05-18 NOTE — Progress Notes (Signed)
NONSTRESS TEST INTERPRETATION  INDICATIONS: Post Dates  FHR baseline: 140bpm RESULTS:Reactive COMMENTS: Pt for induction tonight, also growth and AFI today    PLAN: 1. Continue fetal kick counts twice a day. 2. Continue antepartum testing as scheduled-Biweekly 3. Go to ER tonight for induction   April Norris, CMA

## 2016-05-18 NOTE — Patient Instructions (Signed)
Nonstress Test The nonstress test is a procedure that monitors the fetus's heartbeat. The test will monitor the heartbeat when the fetus is at rest and while the fetus is moving. In a healthy fetus, there will be an increase in fetal heart rate when the fetus moves or kicks. The heart rate will decrease at rest. This test helps determine if the fetus is healthy. Your health care provider will look at a number of patterns in the heart rate tracing to make sure your baby is thriving. If there is concern, your health care provider may order additional tests or may suggest another course of action. This test is often done in the third trimester and can help determine if an early delivery is needed and safe. Common reasons to have this test are:  You are past your due date.  You have a high-risk pregnancy.  You are feeling less movement than normal.  You have lost a pregnancy in the past.  Your health care provider suspects fetal growth problems.  You have too much or too little amniotic fluid. BEFORE THE PROCEDURE  Eat a meal right before the test or as directed by your health care provider. Food may help stimulate fetal movements.  Use the restroom right before the test. PROCEDURE  Two belts will be placed around your abdomen. These belts have monitors attached to them. One records the fetal heart rate and the other records uterine contractions.  You may be asked to lie down on your side or to stay sitting upright.  You may be given a button to press when you feel movement.  The fetal heartbeat is listened to and watched on a screen. The heartbeat is recorded on a sheet of paper.  If the fetus seems to be sleeping, you may be asked to drink some juice or soda, gently press your abdomen, or make some noise to wake the fetus. AFTER THE PROCEDURE  Your health care provider will discuss the test results with you and make recommendations for the near future. This information is not intended  to replace advice given to you by your health care provider. Make sure you discuss any questions you have with your health care provider. Document Released: 04/27/2002 Document Revised: 05/28/2014 Document Reviewed: 06/10/2012 Elsevier Interactive Patient Education  2017 Elsevier Inc.  

## 2016-05-19 DIAGNOSIS — Z3403 Encounter for supervision of normal first pregnancy, third trimester: Secondary | ICD-10-CM

## 2016-05-19 LAB — CBC
HCT: 27.2 % — ABNORMAL LOW (ref 35.0–47.0)
HEMOGLOBIN: 9 g/dL — AB (ref 12.0–16.0)
MCH: 28.9 pg (ref 26.0–34.0)
MCHC: 33 g/dL (ref 32.0–36.0)
MCV: 87.8 fL (ref 80.0–100.0)
PLATELETS: 225 10*3/uL (ref 150–440)
RBC: 3.1 MIL/uL — AB (ref 3.80–5.20)
RDW: 16.6 % — ABNORMAL HIGH (ref 11.5–14.5)
WBC: 19.6 10*3/uL — AB (ref 3.6–11.0)

## 2016-05-19 MED ORDER — SENNOSIDES-DOCUSATE SODIUM 8.6-50 MG PO TABS
2.0000 | ORAL_TABLET | ORAL | Status: DC
  Administered 2016-05-20: 2 via ORAL
  Filled 2016-05-19: qty 2

## 2016-05-19 MED ORDER — ZOLPIDEM TARTRATE 5 MG PO TABS: 5.0000 mg | ORAL_TABLET | Freq: Every evening | ORAL | Status: DC | PRN

## 2016-05-19 MED ORDER — IBUPROFEN 600 MG PO TABS
600.0000 mg | ORAL_TABLET | Freq: Four times a day (QID) | ORAL | Status: DC
  Administered 2016-05-19: 600 mg via ORAL
  Filled 2016-05-19 (×2): qty 1

## 2016-05-19 MED ORDER — PRENATAL MULTIVITAMIN CH
1.0000 | ORAL_TABLET | Freq: Every day | ORAL | Status: DC
  Administered 2016-05-19 – 2016-05-20 (×2): 1 via ORAL
  Filled 2016-05-19 (×2): qty 1

## 2016-05-19 MED ORDER — IBUPROFEN 600 MG PO TABS
600.0000 mg | ORAL_TABLET | Freq: Four times a day (QID) | ORAL | Status: DC
  Administered 2016-05-19 – 2016-05-20 (×4): 600 mg via ORAL
  Filled 2016-05-19 (×4): qty 1

## 2016-05-19 MED ORDER — SIMETHICONE 80 MG PO CHEW: 80.0000 mg | CHEWABLE_TABLET | ORAL | Status: DC | PRN

## 2016-05-19 MED ORDER — BENZOCAINE-MENTHOL 20-0.5 % EX AERO: 1.0000 "application " | INHALATION_SPRAY | CUTANEOUS | Status: DC | PRN

## 2016-05-19 MED ORDER — TETANUS-DIPHTH-ACELL PERTUSSIS 5-2.5-18.5 LF-MCG/0.5 IM SUSP: 0.5000 mL | Freq: Once | INTRAMUSCULAR | Status: DC

## 2016-05-19 MED ORDER — ONDANSETRON HCL 4 MG PO TABS: 4.0000 mg | ORAL_TABLET | ORAL | Status: DC | PRN

## 2016-05-19 MED ORDER — COCONUT OIL OIL: 1.0000 "application " | TOPICAL_OIL | Status: DC | PRN

## 2016-05-19 MED ORDER — ACETAMINOPHEN 325 MG PO TABS: 650.0000 mg | ORAL_TABLET | ORAL | Status: DC | PRN

## 2016-05-19 MED ORDER — ONDANSETRON HCL 4 MG/2ML IJ SOLN: 4.0000 mg | INTRAMUSCULAR | Status: DC | PRN

## 2016-05-19 MED ORDER — HYDROCODONE-ACETAMINOPHEN 5-325 MG PO TABS: 1.0000 | ORAL_TABLET | ORAL | Status: DC | PRN

## 2016-05-19 MED ORDER — WITCH HAZEL-GLYCERIN EX PADS: 1.0000 "application " | MEDICATED_PAD | CUTANEOUS | Status: DC | PRN

## 2016-05-19 MED ORDER — DIPHENHYDRAMINE HCL 25 MG PO CAPS: 25.0000 mg | ORAL_CAPSULE | Freq: Four times a day (QID) | ORAL | Status: DC | PRN

## 2016-05-19 MED ORDER — DIBUCAINE 1 % RE OINT: 1.0000 "application " | TOPICAL_OINTMENT | RECTAL | Status: DC | PRN

## 2016-05-19 NOTE — Progress Notes (Signed)
Post Partum Day # 1, s/p SVD  Subjective: no complaints, up ad lib, voiding and tolerating PO  Objective: Temp:  [98.1 F (36.7 C)-98.4 F (36.9 C)] 98.3 F (36.8 C) (12/30 1146) Pulse Rate:  [99-184] 108 (12/30 1146) Resp:  [18-20] 20 (12/30 1146) BP: (100-132)/(51-77) 104/56 (12/30 1146) SpO2:  [99 %-100 %] 100 % (12/30 0800) Weight:  [193 lb (87.5 kg)-193 lb 11.2 oz (87.9 kg)] 193 lb (87.5 kg) (12/29 2042)  Physical Exam:  General: alert and no distress  Lungs: clear to auscultation bilaterally Breasts: normal appearance, no masses or tenderness Heart: regular rate and rhythm, S1, S2 normal, no murmur, click, rub or gallop Pelvis: Lochia: appropriate, Uterine Fundus: firm Extremities: DVT Evaluation: No evidence of DVT seen on physical exam. Negative Homan's sign. No cords or calf tenderness. No significant calf/ankle edema.   CBC Latest Ref Rng & Units 05/19/2016 05/18/2016 02/21/2016  WBC 3.6 - 11.0 K/uL 19.6(H) 13.5(H) -  Hemoglobin 12.0 - 16.0 g/dL 9.0(L) 11.1(L) -  Hematocrit 35.0 - 47.0 % 27.2(L) 33.7(L) 30.5(L)  Platelets 150 - 440 K/uL 225 238 -    Assessment/Plan: Plan for discharge tomorrow and Contraception OCPs. Bottle feeding Anemia, mild asymptomatic.  Will treat with PO iron.  Moderate leukocytosis, will repeat CBC in a.m.  Patient currently afebrile.  Circumcision declined.    LOS: 1 day   Hildred LaserAnika Tona Qualley [facility]

## 2016-05-20 MED ORDER — IBUPROFEN 800 MG PO TABS: 800.0000 mg | ORAL_TABLET | Freq: Three times a day (TID) | ORAL | 1 refills | Status: DC | PRN

## 2016-05-20 MED ORDER — DOCUSATE SODIUM 100 MG PO CAPS: 100.0000 mg | ORAL_CAPSULE | Freq: Two times a day (BID) | ORAL | 2 refills | Status: DC | PRN

## 2016-05-20 NOTE — Progress Notes (Signed)
Post Partum Day # 2, s/p SVD  Subjective: no complaints, up ad lib, voiding and tolerating PO  Objective: Vitals:   05/19/16 1551 05/19/16 2000 05/20/16 0500 05/20/16 0813  BP: (!) 102/57 109/64 (!) 101/51 (!) 99/52  Pulse: 99 97 (!) 109 (!) 103  Resp: 18 18 18 18   Temp: 98.5 F (36.9 C) 98.2 F (36.8 C) 98.3 F (36.8 C) 98.5 F (36.9 C)  TempSrc: Oral Oral Oral Oral  SpO2:  97% 100% 100%  Weight:      Height:        Physical Exam:  General: alert and no distress  Lungs: clear to auscultation bilaterally Breasts: normal appearance, no masses or tenderness Heart: regular rate and rhythm, S1, S2 normal, no murmur, click, rub or gallop Pelvis: Lochia: appropriate, Uterine Fundus: firm Extremities: DVT Evaluation: No evidence of DVT seen on physical exam. Negative Homan's sign. No cords or calf tenderness. No significant calf/ankle edema.   CBC Latest Ref Rng & Units 05/19/2016 05/18/2016 02/21/2016  WBC 3.6 - 11.0 K/uL 19.6(H) 13.5(H) -  Hemoglobin 12.0 - 16.0 g/dL 9.0(L) 11.1(L) -  Hematocrit 35.0 - 47.0 % 27.2(L) 33.7(L) 30.5(L)  Platelets 150 - 440 K/uL 225 238 -    Assessment/Plan: Discharge home and Contraception OCPs. Bottle feeding Anemia, mild asymptomatic.  Will treat with PO iron.  Circumcision declined.    LOS: 2 days   Hildred LaserAnika Remee Charley [facility]

## 2016-05-20 NOTE — Discharge Instructions (Signed)

## 2016-05-20 NOTE — Progress Notes (Addendum)
D/C instructions provided, pt states understanding, aware of follow up appt. Pt completed purple cry video.

## 2016-05-20 NOTE — Discharge Summary (Signed)
Obstetric Discharge Summary Reason for Admission: onset of labor and rupture of membranes Prenatal Procedures: NST and ultrasound Intrapartum Procedures: spontaneous vaginal delivery Postpartum Procedures: none Complications-Operative and Postpartum: vaginal laceration Hemoglobin  Date Value Ref Range Status  05/19/2016 9.0 (L) 12.0 - 16.0 g/dL Final   HCT  Date Value Ref Range Status  05/19/2016 27.2 (L) 35.0 - 47.0 % Final   Hematocrit  Date Value Ref Range Status  02/21/2016 30.5 (L) 34.0 - 46.6 % Final    Physical Exam:  Blood pressure (!) 99/52, pulse (!) 103, temperature 98.5 F (36.9 C), temperature source Oral, resp. rate 18, height 5\' 3"  (1.6 m), weight 193 lb (87.5 kg), last menstrual period 08/08/2015, SpO2 100 %.  General: alert and no distress Lochia: appropriate Uterine Fundus: firm Incision: none DVT Evaluation: No evidence of DVT seen on physical exam. Negative Homan's sign. No cords or calf tenderness. No significant calf/ankle edema.  Discharge Diagnoses: Post-date pregnancy - delivered  Discharge Information: Date: 05/20/2016 Activity: pelvic rest Diet: routine Medications: PNV, Ibuprofen, Colace and Iron Condition: stable Instructions: refer to practice specific booklet Discharge to: home Follow-up Information    April LaserAnika Ronique Simerly, MD Follow up in 6 week(s).   Specialties:  Obstetrics and Gynecology, Radiology Why:  Postpartum visit (should already be scheduled) Contact information: [address] [place] [id number] [phone]           Newborn Data: Live born female  Birth Weight: 6 lb 14.8 oz (3140 g) APGAR: 9, 9 Home with mother.  April Norris 05/20/2016, 9:50 AM

## 2016-05-20 NOTE — Progress Notes (Signed)
D/C home to car via auxiliary in wheelchair.  

## 2016-05-21 LAB — RPR: RPR Ser Ql: NONREACTIVE

## 2016-06-22 NOTE — Progress Notes (Signed)
   OBSTETRICS POSTPARTUM CLINIC PROGRESS NOTE  Subjective:     Basilio CairoSelena Gruwell is a 21 y.o. G1P0 female who presents for a postpartum visit. She is 6 weeks postpartum following a vaginal delivery. I have fully reviewed the prenatal and intrapartum course. The delivery was at 40 gestational weeks.  Anesthesia: IV sedation. Postpartum course has been well. Baby's course has been well. Baby is feeding by bottle - Similac Advance. Bleeding: patient has not resumed menses.  Bowel function is normal. Bladder function is normal. Patient is not sexually active. Contraception method desired is OCP (estrogen/progesterone). Postpartum depression screening: negative. PHQ-9 score is 0.  The following portions of the patient's history were reviewed and updated as appropriate: allergies, current medications, past family history, past medical history, past social history, past surgical history and problem list.  Review of Systems Pertinent items noted in HPI and remainder of comprehensive ROS otherwise negative.   Objective:  Blood pressure 133/70, pulse 73, height 5\' 3"  (1.6 m), weight 180 lb (81.6 kg), unknown if currently breastfeeding.     General:  alert and no distress   Breasts:  inspection negative, no nipple discharge or bleeding, no masses or nodularity palpable  Lungs: clear to auscultation bilaterally  Heart:  regular rate and rhythm, S1, S2 normal, no murmur, click, rub or gallop  Abdomen: soft, non-tender; bowel sounds normal; no masses,  no organomegaly.     Vulva:  normal  Vagina: normal vagina, no discharge, exudate, lesion, or erythema  Cervix:  no cervical motion tenderness and no lesions  Corpus: normal size, contour, position, consistency, mobility, non-tender  Adnexa:  normal adnexa and no mass, fullness, tenderness  Rectal Exam: Not performed.         Labs:  Lab Results  Component Value Date   HGB 9.0 (L) 05/19/2016    Assessment:   Routine postpartum exam.   Mild  postpartum anemia Contraception management  Plan:    1. Contraception: OCP (estrogen/progesterone). Prescribed Junel.  2. Will check Hgb for h/o anemia.  3. Follow up in: 1 year for annual exam, or sooner as needed.    Hildred LaserAnika Ece Cumberland, MD [facility]

## 2016-06-28 ENCOUNTER — Encounter: Payer: Self-pay | Admitting: Obstetrics and Gynecology

## 2016-06-28 ENCOUNTER — Ambulatory Visit (INDEPENDENT_AMBULATORY_CARE_PROVIDER_SITE_OTHER): Payer: Medicaid Other | Admitting: Obstetrics and Gynecology

## 2016-06-28 DIAGNOSIS — O9081 Anemia of the puerperium: Secondary | ICD-10-CM

## 2016-06-28 DIAGNOSIS — Z30011 Encounter for initial prescription of contraceptive pills: Secondary | ICD-10-CM

## 2016-06-28 MED ORDER — NORETHIN ACE-ETH ESTRAD-FE 1-20 MG-MCG PO TABS: 1.0000 | ORAL_TABLET | Freq: Every day | ORAL | 11 refills | Status: DC

## 2016-06-29 LAB — HEMOGLOBIN AND HEMATOCRIT, BLOOD
HEMATOCRIT: 32.4 % — AB (ref 34.0–46.6)
HEMOGLOBIN: 10.2 g/dL — AB (ref 11.1–15.9)

## 2017-05-01 ENCOUNTER — Other Ambulatory Visit: Payer: Self-pay | Admitting: Obstetrics and Gynecology

## 2017-05-01 ENCOUNTER — Ambulatory Visit (INDEPENDENT_AMBULATORY_CARE_PROVIDER_SITE_OTHER): Payer: Medicaid Other | Admitting: Obstetrics and Gynecology

## 2017-05-01 ENCOUNTER — Encounter: Payer: Self-pay | Admitting: Obstetrics and Gynecology

## 2017-05-01 VITALS — BP 135/70 | HR 87 | Wt 190.2 lb

## 2017-05-01 DIAGNOSIS — Z3402 Encounter for supervision of normal first pregnancy, second trimester: Secondary | ICD-10-CM

## 2017-05-01 MED ORDER — DOXYLAMINE-PYRIDOXINE 10-10 MG PO TBEC: 2.0000 | DELAYED_RELEASE_TABLET | Freq: Every day | ORAL | 5 refills | Status: DC

## 2017-05-01 NOTE — Addendum Note (Signed)
Addended by: Brooke DareSICK, Neyda Durango L on: 05/01/2017 11:21 AM   Modules accepted: Orders

## 2017-05-01 NOTE — Progress Notes (Signed)
HPI:      April Norris is a 21 y.o. G2P1001 who LMP was Patient's last menstrual period was 01/07/2017 (approximate).  Subjective:   She presents today for her first obstetric visit at this office.  She [facility] but no examination or other workup.  She reports that after missing a menstrual period she had negative pregnancy tests at home so she does not know her dates.  She reports that she had breast tenderness but this has resolved.  She complains of daily nausea and vomiting. Is currently taking prenatal vitamins. Her first pregnancy was uncomplicated term delivery.    Hx: The following portions of the patient's history were reviewed and updated as appropriate:             She  has a past medical history of Anemia. She does not have any pertinent problems on file. She  has a past surgical history that includes Ganglion cyst excision (Left, 10/11/2014). Her family history includes Cancer in her paternal grandfather; Diabetes in her maternal grandmother and paternal grandmother; Hyperlipidemia in her maternal grandmother. She  reports that  has never smoked. she has never used smokeless tobacco. She reports that she does not drink alcohol or use drugs. She has No Known Allergies.       Review of Systems:  Review of Systems  Constitutional: Denied constitutional symptoms, night sweats, recent illness, fatigue, fever, insomnia and weight loss.  Eyes: Denied eye symptoms, eye pain, photophobia, vision change and visual disturbance.  Ears/Nose/Throat/Neck: Denied ear, nose, throat or neck symptoms, hearing loss, nasal discharge, sinus congestion and sore throat.  Cardiovascular: Denied cardiovascular symptoms, arrhythmia, chest pain/pressure, edema, exercise intolerance, orthopnea and palpitations.  Respiratory: Denied pulmonary symptoms, asthma, pleuritic pain, productive sputum, cough, dyspnea and wheezing.  Gastrointestinal: Denied,  gastro-esophageal reflux, melena, nausea and vomiting.  Genitourinary: Denied genitourinary symptoms including symptomatic vaginal discharge, pelvic relaxation issues, and urinary complaints.  Musculoskeletal: Denied musculoskeletal symptoms, stiffness, swelling, muscle weakness and myalgia.  Dermatologic: Denied dermatology symptoms, rash and scar.  Neurologic: Denied neurology symptoms, dizziness, headache, neck pain and syncope.  Psychiatric: Denied psychiatric symptoms, anxiety and depression.  Endocrine: Denied endocrine symptoms including hot flashes and night sweats.   Meds:   Current Outpatient Medications on File Prior to Visit  Medication Sig Dispense Refill  . Prenatal Vit-Fe Fumarate-FA (PRENATAL MULTIVITAMIN) TABS tablet Take 1 tablet by mouth daily at 12 noon.     No current facility-administered medications on file prior to visit.     Objective:     Vitals:   05/01/17 1010  BP: 135/70  Pulse: 87              Physical examination General NAD, Conversant  HEENT Atraumatic; Op clear with mmm.  Normo-cephalic. Pupils reactive. Anicteric sclerae  Thyroid/Neck Smooth without nodularity or enlargement. Normal ROM.  Neck Supple.  Skin No rashes, lesions or ulceration. Normal palpated skin turgor. No nodularity.  Breasts: No masses or discharge.  Symmetric.  No axillary adenopathy.  Lungs: Clear to auscultation.No rales or wheezes. Normal Respiratory effort, no retractions.  Heart: NSR.  No murmurs or rubs appreciated. No periferal edema  Abdomen: Soft.  Non-tender.  No masses.  No HSM. No hernia  Extremities: Moves all appropriately.  Normal ROM for age. No lymphadenopathy.  Neuro: Oriented to PPT.  Normal mood. Normal affect.     Pelvic:   Vulva: Normal appearance.  No lesions.  Vagina: No lesions or  abnormalities noted.  Support: Normal pelvic support.  Urethra No masses tenderness or scarring.  Meatus Normal size without lesions or prolapse.  Cervix: Normal  appearance.  No lesions.  Anus: Normal exam.  No lesions.  Perineum: Normal exam.  No lesions.        Bimanual   Adnexae: No masses.  Non-tender to palpation.  Uterus: Enlarged. 14 wks  Non-tender.  Mobile.  AV.  Adnexae: No masses.  Non-tender to palpation.  Cul-de-sac: Negative for abnormality.  Adnexae: No masses.  Non-tender to palpation.         Pelvimetry   Diagonal: Reached.  Spines: Average.  Sacrum: Concave.  Pubic Arch: Normal.      Assessment:    G2P1001 Patient Active Problem List   Diagnosis Date Noted  . Uterine contractions during pregnancy 05/18/2016  . Excessive weight gain during pregnancy in third trimester 03/20/2016  . Nausea and vomiting during pregnancy 10/26/2015  . Supervision of normal first teen pregnancy, third trimester 10/26/2015     1. Encounter for supervision of normal first pregnancy in second trimester     Patient with unknown dates.   Plan:            Prenatal Plan 1.  The patient was given prenatal literature. 2.  She was continued on prenatal vitamins. 3.  A prenatal lab panel was ordered or drawn. 4.  An ultrasound was ordered to better determine an EDC. 5.  MaterniT 21 and AFP drawn today 6.  Genetic testing and testing for other inheritable conditions discussed in detail. S 7.  A general overview of pregnancy testing, visit schedule, ultrasound schedule, and prenatal care was discussed.   Orders No orders of the defined types were placed in this encounter.   No orders of the defined types were placed in this encounter.     F/U  Return in about 4 weeks (around 05/29/2017). I spent 22 minutes with this patient of which greater than 50% was spent discussing  prenatal testing, visit schedule, practice orientation to female and female doctors, genetic testing. Elonda Huskyavid J. Evans, M.D. 05/01/2017 10:53 AM

## 2017-05-01 NOTE — Addendum Note (Signed)
Addended by: Elonda HuskyEVANS, Larri Brewton J on: 05/01/2017 10:58 AM   Modules accepted: Orders

## 2017-05-02 ENCOUNTER — Telehealth: Payer: Self-pay | Admitting: Obstetrics and Gynecology

## 2017-05-02 LAB — RUBELLA SCREEN: RUBELLA: 1.05 {index} (ref >0.99)

## 2017-05-02 LAB — URINALYSIS, ROUTINE W REFLEX MICROSCOPIC
Bilirubin, UA: NEGATIVE
Glucose, UA: NEGATIVE
Leukocytes, UA: NEGATIVE
NITRITE UA: NEGATIVE
PH UA: 6.5 (ref 5.0–7.5)
RBC, UA: NEGATIVE
Specific Gravity, UA: >=1.03 — AB (ref 1.005–1.030)
UUROB: 0.2 mg/dL (ref 0.2–1.0)

## 2017-05-02 LAB — CBC WITH DIFFERENTIAL
BASOS ABS: 0 10*3/uL (ref 0.0–0.2)
Basos: 0 %
EOS (ABSOLUTE): 0.1 10*3/uL (ref 0.0–0.4)
Eos: 1 %
HEMOGLOBIN: 12.9 g/dL (ref 11.1–15.9)
Hematocrit: 38.7 % (ref 34.0–46.6)
IMMATURE GRANS (ABS): 0 10*3/uL (ref 0.0–0.1)
IMMATURE GRANULOCYTES: 0 %
Lymphocytes Absolute: 2.3 10*3/uL (ref 0.7–3.1)
Lymphs: 24 %
MCH: 29.3 pg (ref 26.6–33.0)
MCHC: 33.3 g/dL (ref 31.5–35.7)
MCV: 88 fL (ref 79–97)
MONOCYTES: 4 %
Monocytes Absolute: 0.4 10*3/uL (ref 0.1–0.9)
NEUTROS PCT: 71 %
Neutrophils Absolute: 6.6 10*3/uL (ref 1.4–7.0)
RBC: 4.41 x10E6/uL (ref 3.77–5.28)
RDW: 14 % (ref 12.3–15.4)
WBC: 9.5 10*3/uL (ref 3.4–10.8)

## 2017-05-02 LAB — HEPATITIS B SURFACE ANTIGEN: HEP B S AG: NEGATIVE

## 2017-05-02 LAB — HIV ANTIBODY (ROUTINE TESTING W REFLEX): HIV SCREEN 4TH GENERATION: NONREACTIVE

## 2017-05-02 LAB — ABO AND RH: RH TYPE: POSITIVE

## 2017-05-02 LAB — SICKLE CELL SCREEN: Sickle Cell Screen: NEGATIVE

## 2017-05-02 LAB — VARICELLA ZOSTER ANTIBODY, IGG: Varicella zoster IgG: >4000 index (ref >165)

## 2017-05-02 LAB — ANTIBODY SCREEN: ANTIBODY SCREEN: NEGATIVE

## 2017-05-02 LAB — RPR: RPR Ser Ql: NONREACTIVE

## 2017-05-02 NOTE — Telephone Encounter (Signed)
Spoke with Sheril- please disregard referral- provider error.

## 2017-05-02 NOTE — Telephone Encounter (Signed)
The patient was referred to a office by Dr. Logan BoresEvans yesterday and the referring office would like to know if a mistake was made in regards to the location. She would like a call back from Dr. Logan BoresEvans or his nurse. Please advise.

## 2017-05-03 LAB — GC/CHLAMYDIA PROBE AMP
Chlamydia trachomatis, NAA: NEGATIVE
Neisseria gonorrhoeae by PCR: NEGATIVE

## 2017-05-03 LAB — URINE CULTURE: ORGANISM ID, BACTERIA: NO GROWTH

## 2017-05-05 LAB — AFP, SERUM, OPEN SPINA BIFIDA
AFP MOM: 0.34
AFP Value: 9.8 ng/mL
Gest. Age on Collection Date: 16.3 weeks
MATERNAL AGE AT EDD: 20.5 a
OSBR Risk 1 IN: 10000
Test Results:: NEGATIVE
WEIGHT: 190 [lb_av]

## 2017-05-06 LAB — MONITOR DRUG PROFILE 14(MW)
Amphetamine Scrn, Ur: NEGATIVE ng/mL
BARBITURATE SCREEN URINE: NEGATIVE ng/mL
BENZODIAZEPINE SCREEN, URINE: NEGATIVE ng/mL
CANNABINOIDS UR QL SCN: NEGATIVE ng/mL
Cocaine (Metab) Scrn, Ur: NEGATIVE ng/mL
Creatinine(Crt), U: 310.9 mg/dL — ABNORMAL HIGH (ref 20.0–300.0)
Fentanyl, Urine: NEGATIVE pg/mL
MEPERIDINE SCREEN, URINE: NEGATIVE ng/mL
Methadone Screen, Urine: NEGATIVE ng/mL
OPIATE SCREEN URINE: NEGATIVE ng/mL
OXYCODONE+OXYMORPHONE UR QL SCN: NEGATIVE ng/mL
Ph of Urine: 6.6 (ref 4.5–8.9)
Phencyclidine Qn, Ur: NEGATIVE ng/mL
Propoxyphene Scrn, Ur: NEGATIVE ng/mL
SPECIFIC GRAVITY: 1.027
TRAMADOL SCREEN, URINE: NEGATIVE ng/mL

## 2017-05-06 LAB — NICOTINE SCREEN, URINE: COTININE UR QL SCN: NEGATIVE ng/mL

## 2017-05-06 LAB — BUPRENORPHINE CONFIRM, URINE: Buprenorphine: NEGATIVE

## 2017-05-08 ENCOUNTER — Ambulatory Visit (INDEPENDENT_AMBULATORY_CARE_PROVIDER_SITE_OTHER): Payer: Medicaid Other

## 2017-05-08 ENCOUNTER — Other Ambulatory Visit: Payer: Self-pay | Admitting: Obstetrics and Gynecology

## 2017-05-08 DIAGNOSIS — Z3402 Encounter for supervision of normal first pregnancy, second trimester: Secondary | ICD-10-CM | POA: Diagnosis not present

## 2017-05-10 LAB — MATERNIT 21 PLUS CORE, BLOOD
Chromosome 13: NEGATIVE
Chromosome 18: NEGATIVE
Chromosome 21: NEGATIVE
Y CHROMOSOME: NOT DETECTED

## 2017-05-21 NOTE — L&D Delivery Note (Signed)
Delivery Summary for April CairoSelena Norris  Labor Events:   Preterm labor:   Rupture date: 11/22/2017  Rupture time: 7:43 AM  Rupture type: Artificial Intact Bulging bag of water  Fluid Color:   Induction:   Augmentation:   Complications:   Cervical ripening:          Delivery:   Episiotomy:   Lacerations:   Repair suture:   Repair # of packets:   Blood loss (ml): 900   Information for the patient's newborn:  Susann Givensacheco, Girl [name] [[id number]][[id number]]    Delivery 11/22/2017 10:17 AM by  Vaginal, Spontaneous Sex:  female Gestational Age: 6240w0d Delivery Clinician:   Living?:         APGARS  One minute Five minutes Ten minutes  Skin color:        Heart rate:        Grimace:        Muscle tone:        Breathing:        Totals: 9  10      Presentation/position:      Resuscitation:   Cord information:    Disposition of cord blood:     Blood gases sent?  Complications:   Placenta: Delivered:       appearance Newborn Measurements: Weight: 8 lb 2.5 oz (3700 g)  Height: 20.08"  Head circumference:    Chest circumference:    Other providers:    Additional  information: Forceps:   Vacuum:   Breech:   Observed anomalies       Delivery Note At 10:17 AM a viable and healthy female child was delivered via Vaginal, Spontaneous (Presentation: Vertex ; ROA position).  APGAR: 9, 10; weight 3700 grams.   Placenta status: spontaneously removed, intact.  Cord: 3-vessel with the following complications: loose nuchal x 1, reduced after delivery of the body .  Cord pH: not obtained. Cord blood obtained after delayed cord clamping observed.   Anesthesia:  IV Stadol Episiotomy: None Lacerations: 2nd degree perineal Suture Repair: 2.0 vicryl Est. Blood Loss (mL):  900.  Uterine atony with PPH. Relieved with bimanual massage and 800 mcg of Cytotec placed rectally.  Mom to postpartum.  Baby to Couplet care / Skin to Skin.  Hildred LaserAnika Johnelle Tafolla, MD 11/22/2017, 10:46 AM

## 2017-06-03 ENCOUNTER — Emergency Department
Admission: EM | Admit: 2017-06-03 | Discharge: 2017-06-03 | Disposition: A | Discharge status: Home / Self Care | Payer: Medicaid Other | Attending: Emergency Medicine | Admitting: Emergency Medicine

## 2017-06-03 ENCOUNTER — Other Ambulatory Visit: Payer: Self-pay

## 2017-06-03 ENCOUNTER — Encounter: Payer: Self-pay | Admitting: Emergency Medicine

## 2017-06-03 DIAGNOSIS — B9789 Other viral agents as the cause of diseases classified elsewhere: Secondary | ICD-10-CM | POA: Diagnosis not present

## 2017-06-03 DIAGNOSIS — J069 Acute upper respiratory infection, unspecified: Secondary | ICD-10-CM | POA: Insufficient documentation

## 2017-06-03 DIAGNOSIS — Z79899 Other long term (current) drug therapy: Secondary | ICD-10-CM | POA: Diagnosis not present

## 2017-06-03 DIAGNOSIS — Z3A00 Weeks of gestation of pregnancy not specified: Secondary | ICD-10-CM | POA: Diagnosis not present

## 2017-06-03 DIAGNOSIS — R0981 Nasal congestion: Secondary | ICD-10-CM | POA: Insufficient documentation

## 2017-06-03 DIAGNOSIS — J3489 Other specified disorders of nose and nasal sinuses: Secondary | ICD-10-CM | POA: Insufficient documentation

## 2017-06-03 DIAGNOSIS — R05 Cough: Secondary | ICD-10-CM | POA: Diagnosis present

## 2017-06-03 DIAGNOSIS — O99519 Diseases of the respiratory system complicating pregnancy, unspecified trimester: Secondary | ICD-10-CM | POA: Diagnosis not present

## 2017-06-03 NOTE — Discharge Instructions (Signed)
Keep your appointment with your OB/GYN on Wednesday.  Increase fluids.  Take Tylenol sparingly if needed.

## 2017-06-03 NOTE — ED Notes (Signed)
See triage note  Presents with cough and congestion for about 1 week  Unsure of fever  Afebrile on arrival

## 2017-06-03 NOTE — ED Provider Notes (Signed)
[facility] Emergency Department Provider Note  ____________________________________________   First MD Initiated Contact with Patient 06/03/17 [phone]     (approximate)  I have reviewed the triage vital signs and the nursing notes.   HISTORY  Chief Complaint Cough   HPI April Norris is a 22 y.o. female is here with complaint of cough.  Patient states that she has been coughing for approximately 2-3 weeks.  She denies any fever or chills.  She has not been taking any over-the-counter medication for her cough.  Patient is pregnant.  Denies any nausea, vomiting, diarrhea.  She denies any previous history of asthma, bronchitis or pneumonia.   Past Medical History:  Diagnosis Date  . Anemia     Patient Active Problem List   Diagnosis Date Noted  . Uterine contractions during pregnancy 05/18/2016  . Excessive weight gain during pregnancy in third trimester 03/20/2016  . Nausea and vomiting during pregnancy 10/26/2015  . Supervision of normal first teen pregnancy, third trimester 10/26/2015    Past Surgical History:  Procedure Laterality Date  . GANGLION CYST EXCISION Left 10/11/2014   Procedure: REMOVAL GANGLION OF WRIST;  Surgeon: Deeann SaintHoward Miller, MD;  Location: [facility] ORS;  Service: Orthopedics;  Laterality: Left;    Prior to Admission medications   Medication Sig Start Date End Date Taking? Authorizing Provider  Doxylamine-Pyridoxine (DICLEGIS) 10-10 MG TBEC Take 2 tablets by mouth at bedtime. If symptoms persist, add one tablet in the morning and one in the afternoon 05/01/17   Linzie CollinEvans, David James, MD  Prenatal Vit-Fe Fumarate-FA (PRENATAL MULTIVITAMIN) TABS tablet Take 1 tablet by mouth daily at 12 noon.    [provider]    Allergies Patient has no known allergies.  Family History  Problem Relation Age of Onset  . Diabetes Maternal Grandmother   . Hyperlipidemia Maternal Grandmother   . Diabetes Paternal Grandmother   . Cancer  Paternal Grandfather        ?stomach related    Social History Social History   Tobacco Use  . Smoking status: Never Smoker  . Smokeless tobacco: Never Used  Substance Use Topics  . Alcohol use: No    Alcohol/week: 0.0 oz  . Drug use: No    Review of Systems Constitutional: No fever/chills Cardiovascular: Denies chest pain. Respiratory: Denies shortness of breath.  Positive for cough. Gastrointestinal: No abdominal pain.  No nausea, no vomiting.  Musculoskeletal: Negative for back pain. Skin: Negative for rash. Neurological: Negative for headaches ____________________________________________   PHYSICAL EXAM:  VITAL SIGNS: ED Triage Vitals  Enc Vitals Group     BP 06/03/17 0858 122/73     Pulse Rate 06/03/17 0858 (!) 110     Resp 06/03/17 0858 20     Temp 06/03/17 0858 98.1 F (36.7 C)     Temp Source 06/03/17 0858 Oral     SpO2 06/03/17 0858 96 %     Weight 06/03/17 0853 190 lb (86.2 kg)     Height --      Head Circumference --      Peak Flow --      Pain Score --      Pain Loc --      Pain Edu? --      Excl. in GC? --    Constitutional: Alert and oriented. Well appearing and in no acute distress. Eyes: Conjunctivae are normal.  Head: Atraumatic. Nose: Minimal congestion/rhinnorhea.  EACs and TMs are clear bilaterally. Mouth/Throat: Mucous membranes are moist.  Oropharynx non-erythematous. Neck: No stridor.   Hematological/Lymphatic/Immunilogical: No cervical lymphadenopathy. Cardiovascular: Normal rate, regular rhythm. Grossly normal heart sounds.  Good peripheral circulation. Respiratory: Normal respiratory effort.  No retractions. Lungs CTAB. Musculoskeletal: Moves upper and lower extremities without any difficulty.  Normal gait was noted. Neurologic:  Normal speech and language. No gross focal neurologic deficits are appreciated. No gait instability. Skin:  Skin is warm, dry and intact. No rash noted. Psychiatric: Mood and affect are normal. Speech and  behavior are normal.  ____________________________________________   LABS (all labs ordered are listed, but only abnormal results are displayed)  Labs Reviewed - No data to display   PROCEDURES  Procedure(s) performed: None  Procedures  Critical Care performed: No  ____________________________________________   INITIAL IMPRESSION / ASSESSMENT AND PLAN / ED COURSE Patient was told most likely this is a viral illness and should run its course.  She is encouraged to keep her appointment with her OB/GYN this week.  She will increase fluids.  We also discussed taking Tylenol sparingly.  ____________________________________________   FINAL CLINICAL IMPRESSION(S) / ED DIAGNOSES  Final diagnoses:  Viral URI with cough     ED Discharge Orders    None       Note:  This document was prepared using Dragon voice recognition software and may include unintentional dictation errors.    Tommi Rumps, PA-C 06/03/17 1245    Schaevitz, Myra Rude, MD 06/03/17 [phone]

## 2017-06-03 NOTE — ED Triage Notes (Signed)
Pt presents to stat desk with reports of having a cough. No resp distress noted.

## 2017-06-05 ENCOUNTER — Encounter: Payer: Self-pay | Admitting: Obstetrics and Gynecology

## 2017-06-05 ENCOUNTER — Ambulatory Visit (INDEPENDENT_AMBULATORY_CARE_PROVIDER_SITE_OTHER): Payer: Medicaid Other | Admitting: Obstetrics and Gynecology

## 2017-06-05 VITALS — BP 119/77 | HR 112 | Wt 183.6 lb

## 2017-06-05 DIAGNOSIS — Z23 Encounter for immunization: Secondary | ICD-10-CM

## 2017-06-05 DIAGNOSIS — R0989 Other specified symptoms and signs involving the circulatory and respiratory systems: Secondary | ICD-10-CM

## 2017-06-05 DIAGNOSIS — Z3482 Encounter for supervision of other normal pregnancy, second trimester: Secondary | ICD-10-CM

## 2017-06-05 LAB — POCT URINALYSIS DIPSTICK
Bilirubin, UA: 1
GLUCOSE UA: NEGATIVE
Nitrite, UA: NEGATIVE
SPEC GRAV UA: 1.01 (ref 1.010–1.025)
Urobilinogen, UA: 4 E.U./dL — AB
pH, UA: 8 (ref 5.0–8.0)

## 2017-06-05 MED ORDER — ONDANSETRON 4 MG PO TBDP: 4.0000 mg | ORAL_TABLET | Freq: Four times a day (QID) | ORAL | 0 refills | Status: DC | PRN

## 2017-06-05 NOTE — Progress Notes (Signed)
ROB: Notes productive cough. Denies fevers, chills.  Advised on Robitussin and Mucinex.  Was seen in ER last weekend for symptoms. Partner and baby recently diagnosed with pneumonia.  Also notes nausea/vomiting despite use of Diclegis. Will prescribe Zofran.  Discussed genetic testing results. Flu vaccine given today. RTC in 4 weeks. For anatomy scan next visit.

## 2017-06-05 NOTE — Progress Notes (Signed)
ROB- Pt has no complaints, did mention she went to ER due to on going cough, still coughing with mucus. Abnormal U/A

## 2017-06-21 ENCOUNTER — Telehealth: Payer: Self-pay | Admitting: Obstetrics and Gynecology

## 2017-06-21 MED ORDER — ONDANSETRON 4 MG PO TBDP: 4.0000 mg | ORAL_TABLET | Freq: Four times a day (QID) | ORAL | 0 refills | Status: DC | PRN

## 2017-06-21 NOTE — Telephone Encounter (Signed)
Pt aware med erx. 

## 2017-06-21 NOTE — Telephone Encounter (Signed)
The patient called and stated that she needs a refill of her medication ondansetron (ZOFRAN ODT) 4 MG disintegrating tablet, No other information was disclosed. please advise.

## 2017-07-03 ENCOUNTER — Encounter: Payer: Medicaid Other | Admitting: Obstetrics and Gynecology

## 2017-07-04 ENCOUNTER — Ambulatory Visit (INDEPENDENT_AMBULATORY_CARE_PROVIDER_SITE_OTHER): Payer: Medicaid Other

## 2017-07-04 ENCOUNTER — Ambulatory Visit (INDEPENDENT_AMBULATORY_CARE_PROVIDER_SITE_OTHER): Payer: Medicaid Other | Admitting: Obstetrics and Gynecology

## 2017-07-04 VITALS — BP 100/60 | HR 85 | Wt 194.4 lb

## 2017-07-04 DIAGNOSIS — Z3480 Encounter for supervision of other normal pregnancy, unspecified trimester: Secondary | ICD-10-CM

## 2017-07-04 DIAGNOSIS — Z3482 Encounter for supervision of other normal pregnancy, second trimester: Secondary | ICD-10-CM

## 2017-07-04 NOTE — Progress Notes (Signed)
Pt is doing well. Had some light/pink blood x1 day(yesterday)  but haven't seen it since.

## 2017-07-04 NOTE — Progress Notes (Signed)
ROB: Doing well.  No complaints. Noted some light pink spotting after intercourse yesterday but none since.  Given bleeding precautions. S/p normal anatomy scan.  RTC in 4 weeks.

## 2017-07-05 ENCOUNTER — Other Ambulatory Visit: Payer: Self-pay

## 2017-07-05 MED ORDER — ONDANSETRON 4 MG PO TBDP: 4.0000 mg | ORAL_TABLET | Freq: Four times a day (QID) | ORAL | 0 refills | Status: DC | PRN

## 2017-07-11 ENCOUNTER — Telehealth: Payer: Self-pay | Admitting: Obstetrics and Gynecology

## 2017-07-11 NOTE — Telephone Encounter (Signed)
Patient called stating she saw light blood on the toilet paper last night after urinating. She has not had any issues today. She is 21 weeks and 6 days and she states she has not had intercourse. Please Advise. Thanks

## 2017-07-11 NOTE — Telephone Encounter (Signed)
Pt states she wiped x 1 and saw lite pink on the TP. Little on her underwear. NO vag d/c, new sex partners, or uti sx. BM- wnl. NO cramps. Advised pt to monitor. If she sees blood afain to make an appt to be seen.

## 2017-08-01 ENCOUNTER — Ambulatory Visit (INDEPENDENT_AMBULATORY_CARE_PROVIDER_SITE_OTHER): Payer: Medicaid Other | Admitting: Obstetrics and Gynecology

## 2017-08-01 VITALS — BP 86/57 | HR 98 | Wt 203.3 lb

## 2017-08-01 DIAGNOSIS — Z3482 Encounter for supervision of other normal pregnancy, second trimester: Secondary | ICD-10-CM

## 2017-08-01 LAB — POCT URINALYSIS DIPSTICK
Bilirubin, UA: NEGATIVE
Glucose, UA: NEGATIVE
LEUKOCYTES UA: NEGATIVE
NITRITE UA: NEGATIVE
PH UA: 6 (ref 5.0–8.0)
RBC UA: NEGATIVE
Spec Grav, UA: 1.025 (ref 1.010–1.025)
UROBILINOGEN UA: 0.2 U/dL

## 2017-08-01 NOTE — Progress Notes (Signed)
ROB pt is doing well no concerns.  

## 2017-08-01 NOTE — Progress Notes (Signed)
ROB: No complaints.  One hour GCT next visit.

## 2017-08-20 ENCOUNTER — Encounter (INDEPENDENT_AMBULATORY_CARE_PROVIDER_SITE_OTHER): Payer: Self-pay

## 2017-08-28 ENCOUNTER — Encounter: Payer: Medicaid Other | Admitting: Obstetrics and Gynecology

## 2017-08-28 ENCOUNTER — Ambulatory Visit (INDEPENDENT_AMBULATORY_CARE_PROVIDER_SITE_OTHER): Payer: Medicaid Other | Admitting: Obstetrics and Gynecology

## 2017-08-28 VITALS — BP 91/63 | HR 103 | Wt 210.2 lb

## 2017-08-28 DIAGNOSIS — Z113 Encounter for screening for infections with a predominantly sexual mode of transmission: Secondary | ICD-10-CM

## 2017-08-28 DIAGNOSIS — O0932 Supervision of pregnancy with insufficient antenatal care, second trimester: Secondary | ICD-10-CM

## 2017-08-28 DIAGNOSIS — Z131 Encounter for screening for diabetes mellitus: Secondary | ICD-10-CM

## 2017-08-28 DIAGNOSIS — Z3483 Encounter for supervision of other normal pregnancy, third trimester: Secondary | ICD-10-CM | POA: Diagnosis not present

## 2017-08-28 DIAGNOSIS — Z130 Encounter for screening for diseases of the blood and blood-forming organs and certain disorders involving the immune mechanism: Secondary | ICD-10-CM

## 2017-08-28 DIAGNOSIS — O99210 Obesity complicating pregnancy, unspecified trimester: Secondary | ICD-10-CM

## 2017-08-28 LAB — POCT URINALYSIS DIPSTICK
Bilirubin, UA: NEGATIVE
GLUCOSE UA: NEGATIVE
Ketones, UA: NEGATIVE
LEUKOCYTES UA: NEGATIVE
Nitrite, UA: NEGATIVE
RBC UA: NEGATIVE
Spec Grav, UA: <=1.005 — AB (ref 1.010–1.025)
Urobilinogen, UA: 0.2 E.U./dL
pH, UA: 8.5 — AB (ref 5.0–8.0)

## 2017-08-28 MED ORDER — TETANUS-DIPHTH-ACELL PERTUSSIS 5-2.5-18.5 LF-MCG/0.5 IM SUSP
0.5000 mL | Freq: Once | INTRAMUSCULAR | Status: AC
  Administered 2017-08-28: 0.5 mL via INTRAMUSCULAR

## 2017-08-28 NOTE — Addendum Note (Signed)
Addended by: Silvano BilisHAMPTON, Ramona Ruark L on: 08/28/2017 11:52 AM   Modules accepted: Orders

## 2017-08-28 NOTE — Progress Notes (Signed)
ROB pt is doing well no concerns.  

## 2017-08-28 NOTE — Progress Notes (Signed)
ROB: Patient doing well. Does note some pelvic pressure.  For 28 week labs today.  Desires to bottle feed, but was ok with breast pumping after further discussion (does not desire infant latching),  Desires Nexplanon for contraception. For Tdap today, signed blood consent, discussed cord blood banking. RTC in 2 weeks.

## 2017-09-04 ENCOUNTER — Other Ambulatory Visit: Payer: Medicaid Other

## 2017-09-04 ENCOUNTER — Other Ambulatory Visit: Payer: Self-pay | Admitting: Obstetrics and Gynecology

## 2017-09-04 ENCOUNTER — Other Ambulatory Visit: Payer: Self-pay

## 2017-09-04 DIAGNOSIS — Z130 Encounter for screening for diseases of the blood and blood-forming organs and certain disorders involving the immune mechanism: Secondary | ICD-10-CM

## 2017-09-04 DIAGNOSIS — Z131 Encounter for screening for diabetes mellitus: Secondary | ICD-10-CM

## 2017-09-04 DIAGNOSIS — Z113 Encounter for screening for infections with a predominantly sexual mode of transmission: Secondary | ICD-10-CM

## 2017-09-05 LAB — CBC
HEMATOCRIT: 29.1 % — AB (ref 34.0–46.6)
Hemoglobin: 9.2 g/dL — ABNORMAL LOW (ref 11.1–15.9)
MCH: 26 pg — ABNORMAL LOW (ref 26.6–33.0)
MCHC: 31.6 g/dL (ref 31.5–35.7)
MCV: 82 fL (ref 79–97)
PLATELETS: 339 10*3/uL (ref 150–379)
RBC: 3.54 x10E6/uL — ABNORMAL LOW (ref 3.77–5.28)
RDW: 14.3 % (ref 12.3–15.4)
WBC: 9.7 10*3/uL (ref 3.4–10.8)

## 2017-09-05 LAB — RPR: RPR: NONREACTIVE

## 2017-09-05 LAB — GLUCOSE, 1 HOUR GESTATIONAL: GESTATIONAL DIABETES SCREEN: 128 mg/dL (ref 65–139)

## 2017-09-06 ENCOUNTER — Other Ambulatory Visit: Payer: Self-pay | Admitting: Obstetrics and Gynecology

## 2017-09-06 MED ORDER — DOCUSATE SODIUM 100 MG PO CAPS: 100.0000 mg | ORAL_CAPSULE | Freq: Two times a day (BID) | ORAL | 2 refills | Status: DC | PRN

## 2017-09-06 MED ORDER — FERROUS SULFATE 325 (65 FE) MG PO TABS
325.0000 mg | ORAL_TABLET | Freq: Two times a day (BID) | ORAL | 3 refills | Status: DC
Start: 2017-09-06 — End: 2017-11-23

## 2017-09-11 ENCOUNTER — Encounter: Payer: Self-pay | Admitting: Obstetrics and Gynecology

## 2017-09-11 ENCOUNTER — Ambulatory Visit (INDEPENDENT_AMBULATORY_CARE_PROVIDER_SITE_OTHER): Payer: Medicaid Other | Admitting: Obstetrics and Gynecology

## 2017-09-11 VITALS — BP 95/65 | HR 102 | Wt 214.2 lb

## 2017-09-11 DIAGNOSIS — Z3483 Encounter for supervision of other normal pregnancy, third trimester: Secondary | ICD-10-CM | POA: Diagnosis not present

## 2017-09-11 LAB — POCT URINALYSIS DIPSTICK
Blood, UA: NEGATIVE
GLUCOSE UA: NEGATIVE
KETONES UA: NEGATIVE
NITRITE UA: NEGATIVE
ODOR: NEGATIVE
SPEC GRAV UA: 1.01 (ref 1.010–1.025)
Urobilinogen, UA: 0.2 E.U./dL
pH, UA: 8 (ref 5.0–8.0)

## 2017-09-11 NOTE — Progress Notes (Signed)
ROB: Patient without complaint.  Reports active fetal movement.

## 2017-09-11 NOTE — Progress Notes (Signed)
ROB- no complaints.  

## 2017-09-25 ENCOUNTER — Ambulatory Visit (INDEPENDENT_AMBULATORY_CARE_PROVIDER_SITE_OTHER): Payer: Medicaid Other | Admitting: Obstetrics and Gynecology

## 2017-09-25 VITALS — BP 105/62 | HR 118 | Wt 217.8 lb

## 2017-09-25 DIAGNOSIS — Z3483 Encounter for supervision of other normal pregnancy, third trimester: Secondary | ICD-10-CM

## 2017-09-25 DIAGNOSIS — O2603 Excessive weight gain in pregnancy, third trimester: Secondary | ICD-10-CM

## 2017-09-25 LAB — POCT URINALYSIS DIPSTICK
BILIRUBIN UA: NEGATIVE
Blood, UA: NEGATIVE
GLUCOSE UA: NEGATIVE
Ketones, UA: NEGATIVE
Leukocytes, UA: NEGATIVE
Nitrite, UA: NEGATIVE
Spec Grav, UA: 1.025 (ref 1.010–1.025)
Urobilinogen, UA: 0.2 E.U./dL
pH, UA: 7 (ref 5.0–8.0)

## 2017-09-25 NOTE — Progress Notes (Signed)
ROB pt is doing well no concerns.  

## 2017-09-25 NOTE — Patient Instructions (Signed)
Eating Plan for Pregnant Women While you are pregnant, your body will require additional nutrition to help support your growing baby. It is recommended that you consume:  150 additional calories each day during your first trimester.  300 additional calories each day during your second trimester.  300 additional calories each day during your third trimester.  Eating a healthy, well-balanced diet is very important for your health and for your baby's health. You also have a higher need for some vitamins and minerals, such as folic acid, calcium, iron, and vitamin D. What do I need to know about eating during pregnancy?  Do not try to lose weight or go on a diet during pregnancy.  Choose healthy, nutritious foods. Choose  of a sandwich with a glass of milk instead of a candy bar or a high-calorie sugar-sweetened beverage.  Limit your overall intake of foods that have "empty calories." These are foods that have little nutritional value, such as sweets, desserts, candies, sugar-sweetened beverages, and fried foods.  Eat a variety of foods, especially fruits and vegetables.  Take a prenatal vitamin to help meet the additional needs during pregnancy, specifically for folic acid, iron, calcium, and vitamin D.  Remember to stay active. Ask your health care provider for exercise recommendations that are specific to you.  Practice good food safety and cleanliness, such as washing your hands before you eat and after you prepare raw meat. This helps to prevent foodborne illnesses, such as listeriosis, that can be very dangerous for your baby. Ask your health care provider for more information about listeriosis. What does 150 extra calories look like? Healthy options for an additional 150 calories each day could be any of the following:  Plain low-fat yogurt (6-8 oz) with  cup of berries.  1 apple with 2 teaspoons of peanut butter.  Cut-up vegetables with  cup of hummus.  Low-fat chocolate  milk (8 oz or 1 cup).  1 string cheese with 1 medium orange.   of a peanut butter and jelly sandwich on whole-wheat bread (1 tsp of peanut butter).  For 300 calories, you could eat two of those healthy options each day. What is a healthy amount of weight to gain? The recommended amount of weight for you to gain is based on your pre-pregnancy BMI. If your pre-pregnancy BMI was:  Less than 18 (underweight), you should gain 28-40 lb.  18-24.9 (normal), you should gain 25-35 lb.  25-29.9 (overweight), you should gain 15-25 lb.  Greater than 30 (obese), you should gain 11-20 lb.  What if I am having twins or multiples? Generally, pregnant women who will be having twins or multiples may need to increase their daily calories by 300-600 calories each day. The recommended range for total weight gain is 25-54 lb, depending on your pre-pregnancy BMI. Talk with your health care provider for specific guidance about additional nutritional needs, weight gain, and exercise during your pregnancy. What foods can I eat? Grains Any grains. Try to choose whole grains, such as whole-wheat bread, oatmeal, or brown rice. Vegetables Any vegetables. Try to eat a variety of colors and types of vegetables to get a full range of vitamins and minerals. Remember to wash your vegetables well before eating. Fruits Any fruits. Try to eat a variety of colors and types of fruit to get a full range of vitamins and minerals. Remember to wash your fruits well before eating. Meats and Other Protein Sources Lean meats, including chicken, turkey, fish, and lean cuts of beef, veal,   or pork. Make sure that all meats are cooked to "well done." Tofu. Tempeh. Beans. Eggs. Peanut butter and other nut butters. Seafood, such as shrimp, crab, and lobster. If you choose fish, select types that are higher in omega-3 fatty acids, including salmon, herring, mussels, trout, sardines, and pollock. Make sure that all meats are cooked to  food-safe temperatures. Dairy Pasteurized milk and milk alternatives. Pasteurized yogurt and pasteurized cheese. Cottage cheese. Sour cream. Beverages Water. Juices that contain 100% fruit juice or vegetable juice. Caffeine-free teas and decaffeinated coffee. Drinks that contain caffeine are okay to drink, but it is better to avoid caffeine. Keep your total caffeine intake to less than 200 mg each day (12 oz of coffee, tea, or soda) or as directed by your health care provider. Condiments Any pasteurized condiments. Sweets and Desserts Any sweets and desserts. Fats and Oils Any fats and oils. The items listed above may not be a complete list of recommended foods or beverages. Contact your dietitian for more options. What foods are not recommended? Vegetables Unpasteurized (raw) vegetable juices. Fruits Unpasteurized (raw) fruit juices. Meats and Other Protein Sources Cured meats that have nitrates, such as bacon, salami, and hotdogs. Luncheon meats, bologna, or other deli meats (unless they are reheated until they are steaming hot). Refrigerated pate, meat spreads from a meat counter, smoked seafood that is found in the refrigerated section of a store. Raw fish, such as sushi or sashimi. High mercury content fish, such as tilefish, shark, swordfish, and king mackerel. Raw meats, such as tuna or beef tartare. Undercooked meats and poultry. Make sure that all meats are cooked to food-safe temperatures. Dairy Unpasteurized (raw) milk and any foods that have raw milk in them. Soft cheeses, such as feta, queso blanco, queso fresco, Brie, Camembert cheeses, blue-veined cheeses, and Panela cheese (unless it is made with pasteurized milk, which must be stated on the label). Beverages Alcohol. Sugar-sweetened beverages, such as sodas, teas, or energy drinks. Condiments Homemade fermented foods and drinks, such as pickles, sauerkraut, or kombucha drinks. (Store-bought pasteurized versions of these are  okay.) Other Salads that are made in the store, such as ham salad, chicken salad, egg salad, tuna salad, and seafood salad. The items listed above may not be a complete list of foods and beverages to avoid. Contact your dietitian for more information. This information is not intended to replace advice given to you by your health care provider. Make sure you discuss any questions you have with your health care provider. Document Released: 02/19/2014 Document Revised: 10/13/2015 Document Reviewed: 10/20/2013 Elsevier Interactive Patient Education  2018 Elsevier Inc.   

## 2017-09-25 NOTE — Progress Notes (Signed)
ROB: Doing well, no complaints.  Discussed excessive weight gain in pregnancy (TWG 52 lbs today).  May need referral to nutritionist if further weight gain.  Discussed exercise in pregnancy.  RTC in 2 weeks.

## 2017-10-09 ENCOUNTER — Encounter: Payer: Self-pay | Admitting: Obstetrics and Gynecology

## 2017-10-09 ENCOUNTER — Ambulatory Visit (INDEPENDENT_AMBULATORY_CARE_PROVIDER_SITE_OTHER): Payer: Medicaid Other | Admitting: Obstetrics and Gynecology

## 2017-10-09 VITALS — BP 104/68 | HR 85 | Wt 218.5 lb

## 2017-10-09 DIAGNOSIS — O2603 Excessive weight gain in pregnancy, third trimester: Secondary | ICD-10-CM

## 2017-10-09 DIAGNOSIS — Z3483 Encounter for supervision of other normal pregnancy, third trimester: Secondary | ICD-10-CM

## 2017-10-09 LAB — POCT URINALYSIS DIPSTICK
Bilirubin, UA: NEGATIVE
Blood, UA: NEGATIVE
GLUCOSE UA: NEGATIVE
KETONES UA: NEGATIVE
Leukocytes, UA: NEGATIVE
Nitrite, UA: NEGATIVE
Protein, UA: POSITIVE — AB
Urobilinogen, UA: 0.2 E.U./dL
pH, UA: 6.5 (ref 5.0–8.0)

## 2017-10-09 NOTE — Progress Notes (Signed)
ROB: No complaints.  Only 1 pound of weight gain this time.  Needs GC/CT GBS  next visit.

## 2017-10-09 NOTE — Progress Notes (Signed)
ROB-pt stated that she is doing well no concerns.  

## 2017-10-17 ENCOUNTER — Ambulatory Visit (INDEPENDENT_AMBULATORY_CARE_PROVIDER_SITE_OTHER): Payer: Medicaid Other | Admitting: Obstetrics and Gynecology

## 2017-10-17 VITALS — BP 103/68 | HR 97 | Wt 219.2 lb

## 2017-10-17 DIAGNOSIS — O26843 Uterine size-date discrepancy, third trimester: Secondary | ICD-10-CM

## 2017-10-17 DIAGNOSIS — Z3483 Encounter for supervision of other normal pregnancy, third trimester: Secondary | ICD-10-CM

## 2017-10-17 LAB — POCT URINALYSIS DIPSTICK
BILIRUBIN UA: NEGATIVE
GLUCOSE UA: NEGATIVE
Ketones, UA: NEGATIVE
LEUKOCYTES UA: NEGATIVE
Nitrite, UA: NEGATIVE
PH UA: 7 (ref 5.0–8.0)
Protein, UA: POSITIVE — AB
RBC UA: NEGATIVE
SPEC GRAV UA: 1.02 (ref 1.010–1.025)
UROBILINOGEN UA: 0.2 U/dL

## 2017-10-17 NOTE — Progress Notes (Signed)
ROB-pt stated having some pressure in the pelvic. Pt stated that she is doing well no concerns.

## 2017-10-17 NOTE — Progress Notes (Signed)
ROB: C/o pelvic pressure. Discussed belly band.  Size>D again on today's visit. Will get growth scan. 36 week labs done today. RTC in 1 week.

## 2017-10-19 LAB — STREP GP B NAA: Strep Gp B NAA: NEGATIVE

## 2017-10-20 LAB — GC/CHLAMYDIA PROBE AMP
Chlamydia trachomatis, NAA: NEGATIVE
NEISSERIA GONORRHOEAE BY PCR: NEGATIVE

## 2017-10-24 ENCOUNTER — Ambulatory Visit (INDEPENDENT_AMBULATORY_CARE_PROVIDER_SITE_OTHER): Payer: Self-pay

## 2017-10-24 ENCOUNTER — Encounter: Payer: Self-pay | Admitting: Obstetrics and Gynecology

## 2017-10-24 ENCOUNTER — Ambulatory Visit (INDEPENDENT_AMBULATORY_CARE_PROVIDER_SITE_OTHER): Payer: Self-pay | Admitting: Obstetrics and Gynecology

## 2017-10-24 VITALS — BP 90/62 | HR 97 | Wt 221.2 lb

## 2017-10-24 DIAGNOSIS — O2603 Excessive weight gain in pregnancy, third trimester: Secondary | ICD-10-CM

## 2017-10-24 DIAGNOSIS — O26843 Uterine size-date discrepancy, third trimester: Secondary | ICD-10-CM

## 2017-10-24 DIAGNOSIS — Z3483 Encounter for supervision of other normal pregnancy, third trimester: Secondary | ICD-10-CM

## 2017-10-24 LAB — POCT URINALYSIS DIPSTICK
Bilirubin, UA: NEGATIVE
Blood, UA: NEGATIVE
Glucose, UA: NEGATIVE
Ketones, UA: NEGATIVE
Leukocytes, UA: NEGATIVE
NITRITE UA: NEGATIVE
PROTEIN UA: POSITIVE — AB
Spec Grav, UA: >=1.03 — AB (ref 1.010–1.025)
Urobilinogen, UA: 0.2 E.U./dL
pH, UA: 6.5 (ref 5.0–8.0)

## 2017-10-24 NOTE — Progress Notes (Signed)
ROB-pt stated that she was doing well no concerns.

## 2017-10-24 NOTE — Progress Notes (Signed)
ROB: Without complaints.  Denies contractions.  Ultrasound today for size greater than dates = 42nd percentile growth.  Cultures all negative.

## 2017-10-28 DIAGNOSIS — O2603 Excessive weight gain in pregnancy, third trimester: Secondary | ICD-10-CM | POA: Insufficient documentation

## 2017-10-31 ENCOUNTER — Ambulatory Visit (INDEPENDENT_AMBULATORY_CARE_PROVIDER_SITE_OTHER): Payer: Medicaid Other | Admitting: Obstetrics and Gynecology

## 2017-10-31 VITALS — BP 93/66 | HR 93 | Wt 221.2 lb

## 2017-10-31 DIAGNOSIS — O2600 Excessive weight gain in pregnancy, unspecified trimester: Secondary | ICD-10-CM

## 2017-10-31 DIAGNOSIS — O99210 Obesity complicating pregnancy, unspecified trimester: Secondary | ICD-10-CM

## 2017-10-31 DIAGNOSIS — Z3483 Encounter for supervision of other normal pregnancy, third trimester: Secondary | ICD-10-CM

## 2017-10-31 LAB — POCT URINALYSIS DIPSTICK
Bilirubin, UA: NEGATIVE
Blood, UA: NEGATIVE
Glucose, UA: NEGATIVE
Ketones, UA: NEGATIVE
NITRITE UA: NEGATIVE
PROTEIN UA: POSITIVE — AB
SPEC GRAV UA: 1.015 (ref 1.010–1.025)
Urobilinogen, UA: 0.2 E.U./dL
pH, UA: 7 (ref 5.0–8.0)

## 2017-10-31 NOTE — Progress Notes (Signed)
ROB: Doing well, no complaints. Discussion had on excessive weight gain in pregnancy (TWG 56 lbs). 3rd trimester labs neg. RTC in 1 week.

## 2017-10-31 NOTE — Progress Notes (Signed)
ROB-Pt stated that she was doing well no concerns.  

## 2017-11-07 ENCOUNTER — Ambulatory Visit (INDEPENDENT_AMBULATORY_CARE_PROVIDER_SITE_OTHER): Payer: Self-pay | Admitting: Obstetrics and Gynecology

## 2017-11-07 ENCOUNTER — Encounter: Payer: Self-pay | Admitting: Obstetrics and Gynecology

## 2017-11-07 VITALS — BP 103/68 | HR 89 | Wt 224.2 lb

## 2017-11-07 DIAGNOSIS — Z3483 Encounter for supervision of other normal pregnancy, third trimester: Secondary | ICD-10-CM

## 2017-11-07 LAB — POCT URINALYSIS DIPSTICK
GLUCOSE UA: NEGATIVE
Ketones, UA: NEGATIVE
Nitrite, UA: NEGATIVE
Odor: NEGATIVE
Protein, UA: POSITIVE — AB
RBC UA: NEGATIVE
Spec Grav, UA: 1.015 (ref 1.010–1.025)
Urobilinogen, UA: 0.2 E.U./dL
pH, UA: 7 (ref 5.0–8.0)

## 2017-11-07 NOTE — Progress Notes (Signed)
ROB: S&S of labor discussed.  Patient not contracting yet.  Postdates induction scheduled for 7/5 if necessary.

## 2017-11-07 NOTE — Progress Notes (Signed)
ROB- no compliants.  

## 2017-11-14 ENCOUNTER — Ambulatory Visit (INDEPENDENT_AMBULATORY_CARE_PROVIDER_SITE_OTHER): Payer: Medicaid Other | Admitting: Obstetrics and Gynecology

## 2017-11-14 ENCOUNTER — Other Ambulatory Visit: Payer: Self-pay | Admitting: Obstetrics and Gynecology

## 2017-11-14 ENCOUNTER — Encounter: Payer: Self-pay | Admitting: Obstetrics and Gynecology

## 2017-11-14 VITALS — BP 107/67 | HR 88 | Wt 224.6 lb

## 2017-11-14 DIAGNOSIS — Z3483 Encounter for supervision of other normal pregnancy, third trimester: Secondary | ICD-10-CM

## 2017-11-14 DIAGNOSIS — O480 Post-term pregnancy: Secondary | ICD-10-CM

## 2017-11-14 LAB — POCT URINALYSIS DIPSTICK
BILIRUBIN UA: NEGATIVE
Blood, UA: NEGATIVE
GLUCOSE UA: NEGATIVE
Ketones, UA: NEGATIVE
Nitrite, UA: NEGATIVE
Protein, UA: POSITIVE — AB
Spec Grav, UA: 1.015 (ref 1.010–1.025)
Urobilinogen, UA: 0.2 E.U./dL
pH, UA: 7 (ref 5.0–8.0)

## 2017-11-14 NOTE — Progress Notes (Signed)
ROB-Pt stated that has been getting morning sickness no other complaints.

## 2017-11-14 NOTE — Progress Notes (Signed)
ROB: Denies contractions.  Denies problems.  Cervix favorable.  Signs and symptoms of labor discussed.  BPP with next visit.  Induction scheduled for 7/5.

## 2017-11-19 ENCOUNTER — Ambulatory Visit (INDEPENDENT_AMBULATORY_CARE_PROVIDER_SITE_OTHER): Payer: Medicaid Other

## 2017-11-19 ENCOUNTER — Ambulatory Visit (INDEPENDENT_AMBULATORY_CARE_PROVIDER_SITE_OTHER): Payer: Medicaid Other | Admitting: Obstetrics and Gynecology

## 2017-11-19 VITALS — BP 99/64 | HR 76 | Wt 225.8 lb

## 2017-11-19 DIAGNOSIS — Z3A40 40 weeks gestation of pregnancy: Secondary | ICD-10-CM | POA: Diagnosis not present

## 2017-11-19 DIAGNOSIS — O2600 Excessive weight gain in pregnancy, unspecified trimester: Secondary | ICD-10-CM

## 2017-11-19 DIAGNOSIS — O480 Post-term pregnancy: Secondary | ICD-10-CM | POA: Diagnosis not present

## 2017-11-19 DIAGNOSIS — Z3483 Encounter for supervision of other normal pregnancy, third trimester: Secondary | ICD-10-CM

## 2017-11-19 DIAGNOSIS — O2603 Excessive weight gain in pregnancy, third trimester: Secondary | ICD-10-CM

## 2017-11-19 LAB — POCT URINALYSIS DIPSTICK
BILIRUBIN UA: NEGATIVE
Blood, UA: NEGATIVE
Glucose, UA: NEGATIVE
Ketones, UA: NEGATIVE
Leukocytes, UA: NEGATIVE
Nitrite, UA: NEGATIVE
PH UA: 6.5 (ref 5.0–8.0)
Protein, UA: POSITIVE — AB
Spec Grav, UA: 1.025 (ref 1.010–1.025)
UROBILINOGEN UA: 0.2 U/dL

## 2017-11-19 NOTE — Progress Notes (Signed)
ROB: Doing well, no issues. BPP with growth normal. Scheduled for IOL on Friday for postdates.

## 2017-11-19 NOTE — Patient Instructions (Signed)
Labor Induction Labor induction is when steps are taken to cause a pregnant woman to begin the labor process. Most women go into labor on their own between 37 weeks and 42 weeks of the pregnancy. When this does not happen or when there is a medical need, methods may be used to induce labor. Labor induction causes a pregnant woman's uterus to contract. It also causes the cervix to soften (ripen), open (dilate), and thin out (efface). Usually, labor is not induced before 39 weeks of the pregnancy unless there is a problem with the baby or mother. Before inducing labor, your health care provider will consider a number of factors, including the following:  The medical condition of you and the baby.  How many weeks along you are.  The status of the baby's lung maturity.  The condition of the cervix.  The position of the baby. What are the reasons for labor induction? Labor may be induced for the following reasons:  The health of the baby or mother is at risk.  The pregnancy is overdue by 1 week or more.  The water breaks but labor does not start on its own.  The mother has a health condition or serious illness, such as high blood pressure, infection, placental abruption, or diabetes.  The amniotic fluid amounts are low around the baby.  The baby is distressed. Convenience or wanting the baby to be born on a certain date is not a reason for inducing labor. What methods are used for labor induction? Several methods of labor induction may be used, such as:  Prostaglandin medicine. This medicine causes the cervix to dilate and ripen. The medicine will also start contractions. It can be taken by mouth or by inserting a suppository into the vagina.  Inserting a thin tube (catheter) with a balloon on the end into the vagina to dilate the cervix. Once inserted, the balloon is expanded with water, which causes the cervix to open.  Stripping the membranes. Your health care provider separates  amniotic sac tissue from the cervix, causing the cervix to be stretched and causing the release of a hormone called progesterone. This may cause the uterus to contract. It is often done during an office visit. You will be sent home to wait for the contractions to begin. You will then come in for an induction.  Breaking the water. Your health care provider makes a hole in the amniotic sac using a small instrument. Once the amniotic sac breaks, contractions should begin. This may still take hours to see an effect.  Medicine to trigger or strengthen contractions. This medicine is given through an IV access tube inserted into a vein in your arm. All of the methods of induction, besides stripping the membranes, will be done in the hospital. Induction is done in the hospital so that you and the baby can be carefully monitored. How long does it take for labor to be induced? Some inductions can take up to 2-3 days. Depending on the cervix, it usually takes less time. It takes longer when you are induced early in the pregnancy or if this is your first pregnancy. If a mother is still pregnant and the induction has been going on for 2-3 days, either the mother will be sent home or a cesarean delivery will be needed. What are the risks associated with labor induction? Some of the risks of induction include:  Changes in fetal heart rate, such as too high, too low, or erratic.  Fetal distress.    Chance of infection for the mother and baby.  Increased chance of having a cesarean delivery.  Breaking off (abruption) of the placenta from the uterus (rare).  Uterine rupture (very rare). When induction is needed for medical reasons, the benefits of induction may outweigh the risks. What are some reasons for not inducing labor? Labor induction should not be done if:  It is shown that your baby does not tolerate labor.  You have had previous surgeries on your uterus, such as a myomectomy or the removal of  fibroids.  Your placenta lies very low in the uterus and blocks the opening of the cervix (placenta previa).  Your baby is not in a head-down position.  The umbilical cord drops down into the birth canal in front of the baby. This could cut off the baby's blood and oxygen supply.  You have had a previous cesarean delivery.  There are unusual circumstances, such as the baby being extremely premature. This information is not intended to replace advice given to you by your health care provider. Make sure you discuss any questions you have with your health care provider. Document Released: 09/26/2006 Document Revised: 10/13/2015 Document Reviewed: 12/04/2012 Elsevier Interactive Patient Education  2017 Elsevier Inc.  

## 2017-11-19 NOTE — Progress Notes (Signed)
ROB-pt stated that she is doing well no complaints.  

## 2017-11-20 ENCOUNTER — Telehealth: Payer: Self-pay | Admitting: Obstetrics and Gynecology

## 2017-11-20 NOTE — Telephone Encounter (Signed)
The patient called and stated that she would like to speak with a nurse as soon as possible, The patient is experiencing strong contractions, and has blood in her urine. The patient did not disclose any other information. Please advise.

## 2017-11-20 NOTE — Telephone Encounter (Signed)
Ob 40 5/7- She states she is having ctx q 1 hour. Brownish d/c. NO IC in the lasr 24h. Pos eating and drinking. NO uti sx. NO diarrhea. Pos FM. Pt advised to monitor sx. If she has leaking fluid, vb, or ctx q5 minutes for 2h she is to head to l&d. IOL scheduled for 11/22/2017.

## 2017-11-22 ENCOUNTER — Other Ambulatory Visit: Payer: Self-pay

## 2017-11-22 ENCOUNTER — Inpatient Hospital Stay
Admission: EM | Admit: 2017-11-22 | Discharge: 2017-11-23 | DRG: 806 | Disposition: A | Discharge status: Home / Self Care | Payer: Medicaid Other | Attending: Obstetrics and Gynecology | Admitting: Obstetrics and Gynecology

## 2017-11-22 DIAGNOSIS — D649 Anemia, unspecified: Secondary | ICD-10-CM | POA: Diagnosis present

## 2017-11-22 DIAGNOSIS — O99214 Obesity complicating childbirth: Secondary | ICD-10-CM | POA: Diagnosis present

## 2017-11-22 DIAGNOSIS — O9902 Anemia complicating childbirth: Secondary | ICD-10-CM | POA: Diagnosis present

## 2017-11-22 DIAGNOSIS — Z3A41 41 weeks gestation of pregnancy: Secondary | ICD-10-CM

## 2017-11-22 DIAGNOSIS — O99013 Anemia complicating pregnancy, third trimester: Secondary | ICD-10-CM | POA: Diagnosis not present

## 2017-11-22 DIAGNOSIS — E669 Obesity, unspecified: Secondary | ICD-10-CM | POA: Diagnosis present

## 2017-11-22 DIAGNOSIS — O0932 Supervision of pregnancy with insufficient antenatal care, second trimester: Secondary | ICD-10-CM

## 2017-11-22 DIAGNOSIS — O480 Post-term pregnancy: Principal | ICD-10-CM | POA: Diagnosis present

## 2017-11-22 DIAGNOSIS — O2603 Excessive weight gain in pregnancy, third trimester: Secondary | ICD-10-CM | POA: Diagnosis present

## 2017-11-22 LAB — CBC
HCT: 31.2 % — ABNORMAL LOW (ref 35.0–47.0)
HEMOGLOBIN: 10.1 g/dL — AB (ref 12.0–16.0)
MCH: 25.6 pg — AB (ref 26.0–34.0)
MCHC: 32.3 g/dL (ref 32.0–36.0)
MCV: 79.1 fL — AB (ref 80.0–100.0)
Platelets: 323 10*3/uL (ref 150–440)
RBC: 3.94 MIL/uL (ref 3.80–5.20)
RDW: 17.7 % — ABNORMAL HIGH (ref 11.5–14.5)
WBC: 8.9 10*3/uL (ref 3.6–11.0)

## 2017-11-22 LAB — TYPE AND SCREEN
ABO/RH(D): O POS
Antibody Screen: NEGATIVE

## 2017-11-22 MED ORDER — SENNOSIDES-DOCUSATE SODIUM 8.6-50 MG PO TABS
2.0000 | ORAL_TABLET | ORAL | Status: DC
  Administered 2017-11-23: 2 via ORAL
  Filled 2017-11-22: qty 2

## 2017-11-22 MED ORDER — ONDANSETRON HCL 4 MG PO TABS: 4.0000 mg | ORAL_TABLET | ORAL | Status: DC | PRN

## 2017-11-22 MED ORDER — TERBUTALINE SULFATE 1 MG/ML IJ SOLN: 0.2500 mg | Freq: Once | INTRAMUSCULAR | Status: DC | PRN

## 2017-11-22 MED ORDER — WITCH HAZEL-GLYCERIN EX PADS: 1.0000 "application " | MEDICATED_PAD | CUTANEOUS | Status: DC | PRN

## 2017-11-22 MED ORDER — OXYCODONE-ACETAMINOPHEN 5-325 MG PO TABS
1.0000 | ORAL_TABLET | ORAL | Status: DC | PRN
  Administered 2017-11-22: 1 via ORAL
  Filled 2017-11-22: qty 1

## 2017-11-22 MED ORDER — ZOLPIDEM TARTRATE 5 MG PO TABS: 5.0000 mg | ORAL_TABLET | Freq: Every evening | ORAL | Status: DC | PRN

## 2017-11-22 MED ORDER — ONDANSETRON HCL 4 MG/2ML IJ SOLN: 4.0000 mg | Freq: Four times a day (QID) | INTRAMUSCULAR | Status: DC | PRN

## 2017-11-22 MED ORDER — BUTORPHANOL TARTRATE 1 MG/ML IJ SOLN
1.0000 mg | INTRAMUSCULAR | Status: DC | PRN
  Administered 2017-11-22 (×2): 1 mg via INTRAVENOUS
  Filled 2017-11-22 (×2): qty 1

## 2017-11-22 MED ORDER — SOD CITRATE-CITRIC ACID 500-334 MG/5ML PO SOLN: 30.0000 mL | ORAL | Status: DC | PRN

## 2017-11-22 MED ORDER — MISOPROSTOL 200 MCG PO TABS
ORAL_TABLET | ORAL | Status: AC
  Administered 2017-11-22: 800 ug
  Filled 2017-11-22: qty 4

## 2017-11-22 MED ORDER — IBUPROFEN 600 MG PO TABS
ORAL_TABLET | ORAL | Status: AC
  Administered 2017-11-22: 600 mg via ORAL
  Filled 2017-11-22: qty 1

## 2017-11-22 MED ORDER — BENZOCAINE-MENTHOL 20-0.5 % EX AERO
1.0000 "application " | INHALATION_SPRAY | CUTANEOUS | Status: DC | PRN
  Administered 2017-11-22: 1 via TOPICAL
  Filled 2017-11-22: qty 56

## 2017-11-22 MED ORDER — TETANUS-DIPHTH-ACELL PERTUSSIS 5-2.5-18.5 LF-MCG/0.5 IM SUSP: 0.5000 mL | Freq: Once | INTRAMUSCULAR | Status: DC

## 2017-11-22 MED ORDER — AMMONIA AROMATIC IN INHA
RESPIRATORY_TRACT | Status: AC
  Filled 2017-11-22: qty 10

## 2017-11-22 MED ORDER — OXYTOCIN 10 UNIT/ML IJ SOLN
INTRAMUSCULAR | Status: AC
  Filled 2017-11-22: qty 2

## 2017-11-22 MED ORDER — ACETAMINOPHEN 325 MG PO TABS: 650.0000 mg | ORAL_TABLET | ORAL | Status: DC | PRN

## 2017-11-22 MED ORDER — DIPHENHYDRAMINE HCL 25 MG PO CAPS: 25.0000 mg | ORAL_CAPSULE | Freq: Four times a day (QID) | ORAL | Status: DC | PRN

## 2017-11-22 MED ORDER — ONDANSETRON HCL 4 MG/2ML IJ SOLN: 4.0000 mg | INTRAMUSCULAR | Status: DC | PRN

## 2017-11-22 MED ORDER — OXYTOCIN BOLUS FROM INFUSION
500.0000 mL | Freq: Once | INTRAVENOUS | Status: AC
  Administered 2017-11-22: 500 mL via INTRAVENOUS

## 2017-11-22 MED ORDER — PRENATAL MULTIVITAMIN CH
1.0000 | ORAL_TABLET | Freq: Every day | ORAL | Status: DC
  Administered 2017-11-23: 1 via ORAL
  Filled 2017-11-22: qty 1

## 2017-11-22 MED ORDER — OXYTOCIN 40 UNITS IN LACTATED RINGERS INFUSION - SIMPLE MED
2.5000 [IU]/h | INTRAVENOUS | Status: DC
  Administered 2017-11-22: 2.5 [IU]/h via INTRAVENOUS
  Filled 2017-11-22: qty 1000

## 2017-11-22 MED ORDER — OXYTOCIN 40 UNITS IN LACTATED RINGERS INFUSION - SIMPLE MED: 1.0000 m[IU]/min | INTRAVENOUS | Status: DC

## 2017-11-22 MED ORDER — COCONUT OIL OIL: 1.0000 "application " | TOPICAL_OIL | Status: DC | PRN

## 2017-11-22 MED ORDER — IBUPROFEN 600 MG PO TABS
600.0000 mg | ORAL_TABLET | Freq: Four times a day (QID) | ORAL | Status: DC
  Administered 2017-11-22 – 2017-11-23 (×6): 600 mg via ORAL
  Filled 2017-11-22 (×5): qty 1

## 2017-11-22 MED ORDER — LIDOCAINE HCL (PF) 1 % IJ SOLN
30.0000 mL | INTRAMUSCULAR | Status: DC | PRN
  Filled 2017-11-22: qty 30

## 2017-11-22 MED ORDER — OXYCODONE-ACETAMINOPHEN 5-325 MG PO TABS: 2.0000 | ORAL_TABLET | ORAL | Status: DC | PRN

## 2017-11-22 MED ORDER — SIMETHICONE 80 MG PO CHEW: 80.0000 mg | CHEWABLE_TABLET | ORAL | Status: DC | PRN

## 2017-11-22 MED ORDER — LACTATED RINGERS IV SOLN
INTRAVENOUS | Status: DC
  Administered 2017-11-22: 07:00:00 via INTRAVENOUS

## 2017-11-22 MED ORDER — DIBUCAINE 1 % RE OINT: 1.0000 "application " | TOPICAL_OINTMENT | RECTAL | Status: DC | PRN

## 2017-11-22 MED ORDER — FERROUS SULFATE 325 (65 FE) MG PO TABS
325.0000 mg | ORAL_TABLET | Freq: Two times a day (BID) | ORAL | Status: DC
Start: 2017-11-22 — End: 2017-11-23
  Administered 2017-11-22 – 2017-11-23 (×3): 325 mg via ORAL
  Filled 2017-11-22 (×3): qty 1

## 2017-11-22 MED ORDER — LACTATED RINGERS IV SOLN: 500.0000 mL | INTRAVENOUS | Status: DC | PRN

## 2017-11-22 NOTE — H&P (Signed)
Obstetric History and Physical  April Norris is a 22 y.o. G2P1001 with IUP at 5855w0d presenting for scheduled IOL, however is currently in active labor. Patient states she has been having  regular, every 3-4 minutes contractions, none vaginal bleeding, intact membranes, with active fetal movement.    Prenatal Course Source of Care: [facility] with onset of care at 16 weeks Pregnancy complications or risks: Patient Active Problem List   Diagnosis Date Noted  . Post-dates pregnancy 11/22/2017  . Anemia of pregnancy in third trimester 11/22/2017  . Excessive weight gain during pregnancy in third trimester 10/28/2017  . Obesity in pregnancy 08/28/2017  . Late prenatal care affecting pregnancy in second trimester 08/28/2017    She plans to breast pump and bottle feed She desires Nexplanon for postpartum contraception.   Prenatal labs and studies: ABO, Rh: --/--/PENDING (07/05 16100705) Antibody: PENDING (07/05 0705) Rubella: 1.05 (12/12 1100) RPR: Non Reactive (04/17 0836)  HBsAg: Negative (12/12 1100)  HIV: Non Reactive (12/12 1100)  RUE:[id number]:Negative (05/30 1355) 1 hr Glucola  normal Genetic screening normal Anatomy US normal    Past Medical History:  Diagnosis Date  . Anemia     Past Surgical History:  Procedure Laterality Date  . GANGLION CYST EXCISION Left 10/11/2014   Procedure: REMOVAL GANGLION OF WRIST;  Surgeon: Deeann SaintHoward Miller, MD;  Location: [facility] ORS;  Service: Orthopedics;  Laterality: Left;    OB History  Gravida Para Term Preterm AB Living  2 1 1     1   SAB TAB Ectopic Multiple Live Births          1    # Outcome Date GA Lbr Len/2nd Weight Sex Delivery Anes PTL Lv  2 Current           1 Term 2017   6 lb 8 oz (2.948 kg) M Vag-Spont  N LIV    Social History   Socioeconomic History  . Marital status: Single    Spouse name: Not on file  . Number of children: Not on file  . Years of education: Not on file  . Highest education level: Not on file   Occupational History  . Occupation: Consulting civil engineerstudent  Social Needs  . Financial resource strain: Not on file  . Food insecurity:    Worry: Not on file    Inability: Not on file  . Transportation needs:    Medical: Not on file    Non-medical: Not on file  Tobacco Use  . Smoking status: Never Smoker  . Smokeless tobacco: Never Used  Substance and Sexual Activity  . Alcohol use: No    Alcohol/week: 0.0 oz  . Drug use: No  . Sexual activity: Yes    Partners: Male  Lifestyle  . Physical activity:    Days per week: Not on file    Minutes per session: Not on file  . Stress: Not on file  Relationships  . Social connections:    Talks on phone: Not on file    Gets together: Not on file    Attends religious service: Not on file    Active member of club or organization: Not on file    Attends meetings of clubs or organizations: Not on file    Relationship status: Not on file  Other Topics Concern  . Not on file  Social History Narrative  . Not on file    Family History  Problem Relation Age of Onset  . Diabetes Maternal Grandmother   . Hyperlipidemia  Maternal Grandmother   . Diabetes Paternal Grandmother   . Cancer Paternal Grandfather        ?stomach related    Medications Prior to Admission  Medication Sig Dispense Refill Last Dose  . docusate sodium (COLACE) 100 MG capsule Take 1 capsule (100 mg total) by mouth 2 (two) times daily as needed. 30 capsule 2 Past Month at Unknown time  . ferrous sulfate (FERROUSUL) 325 (65 FE) MG tablet Take 1 tablet (325 mg total) by mouth 2 (two) times daily. 60 tablet 3 Past Week at Unknown time  . ondansetron (ZOFRAN ODT) 4 MG disintegrating tablet Take 1 tablet (4 mg total) by mouth every 6 (six) hours as needed for nausea. 20 tablet 0 Past Month at Unknown time  . Prenatal Vit-Fe Fumarate-FA (PRENATAL MULTIVITAMIN) TABS tablet Take 1 tablet by mouth daily at 12 noon.   11/21/2017 at Unknown time    No Known Allergies  Review of Systems:  Negative except for what is mentioned in HPI.  Physical Exam: BP 122/81   Pulse 87   Temp 98.1 F (36.7 C) (Oral)   Resp 18   Ht 5\' 3"  (1.6 m)   Wt 224 lb (101.6 kg)   LMP 01/07/2017 (Approximate)   BMI 39.68 kg/m  CONSTITUTIONAL: Well-developed, well-nourished female in no acute distress.  HENT:  Normocephalic, atraumatic, External right and left ear normal. Oropharynx is clear and moist EYES: Conjunctivae and EOM are normal. Pupils are equal, round, and reactive to light. No scleral icterus.  NECK: Normal range of motion, supple, no masses SKIN: Skin is warm and dry. No rash noted. Not diaphoretic. No erythema. No pallor. NEUROLOGIC: Alert and oriented to person, place, and time. Normal reflexes, muscle tone coordination. No cranial nerve deficit noted. PSYCHIATRIC: Normal mood and affect. Normal behavior. Normal judgment and thought content. CARDIOVASCULAR: Normal heart rate noted, regular rhythm RESPIRATORY: Effort and breath sounds normal, no problems with respiration noted ABDOMEN: Soft, nontender, nondistended, gravid. MUSCULOSKELETAL: Normal range of motion. No edema and no tenderness. 2+ distal pulses.  Cervical Exam: Dilatation 6 cm   Effacement 80%   Station -3   Presentation: cephalic FHT:  Baseline rate 130 bpm   Variability moderate  Accelerations present   Decelerations none Contractions: Every 2-3 mins   Pertinent Labs/Studies:   Results for orders placed or performed during the hospital encounter of 11/22/17 (from the past 24 hour(s))  CBC     Status: Abnormal   Collection Time: 11/22/17  7:05 AM  Result Value Ref Range   WBC 8.9 3.6 - 11.0 K/uL   RBC 3.94 3.80 - 5.20 MIL/uL   Hemoglobin 10.1 (L) 12.0 - 16.0 g/dL   HCT 16.1 (L) 09.6 - 04.5 %   MCV 79.1 (L) 80.0 - 100.0 fL   MCH 25.6 (L) 26.0 - 34.0 pg   MCHC 32.3 32.0 - 36.0 g/dL   RDW 40.9 (H) 81.1 - 91.4 %   Platelets 323 150 - 440 K/uL  Type and screen     Status: None (Preliminary result)   Collection  Time: 11/22/17  7:05 AM  Result Value Ref Range   ABO/RH(D) PENDING    Antibody Screen PENDING    Sample Expiration      11/25/2017 Performed at [facility], [address], [place], [place] [address]     Assessment : April Norris is a 22 y.o. G2P1001 at [redacted]w[redacted]d being admitted for labor, post-dates pregnancy.  Plan: Labor: AROM'd with clear fluid.  Continue  expectant management. Analgesia as needed. Currently declines epidural.  FWB: Reassuring fetal heart tracing.  GBS negative. Delivery plan: Hopeful for vaginal delivery   Hildred Laser, MD [facility]    Hildred Laser, MD [facility]

## 2017-11-23 LAB — CBC
HEMATOCRIT: 22.7 % — AB (ref 35.0–47.0)
HEMOGLOBIN: 7.4 g/dL — AB (ref 12.0–16.0)
MCH: 25.8 pg — ABNORMAL LOW (ref 26.0–34.0)
MCHC: 32.5 g/dL (ref 32.0–36.0)
MCV: 79.5 fL — AB (ref 80.0–100.0)
Platelets: 253 10*3/uL (ref 150–440)
RBC: 2.85 MIL/uL — AB (ref 3.80–5.20)
RDW: 17.6 % — ABNORMAL HIGH (ref 11.5–14.5)
WBC: 12 10*3/uL — ABNORMAL HIGH (ref 3.6–11.0)

## 2017-11-23 LAB — CHLAMYDIA/NGC RT PCR (ARMC ONLY)
Chlamydia Tr: NOT DETECTED
N GONORRHOEAE: NOT DETECTED

## 2017-11-23 LAB — RPR: RPR: NONREACTIVE

## 2017-11-23 MED ORDER — FERROUS SULFATE 325 (65 FE) MG PO TABS: 325.0000 mg | ORAL_TABLET | Freq: Two times a day (BID) | ORAL | 2 refills | Status: DC

## 2017-11-23 MED ORDER — IBUPROFEN 800 MG PO TABS: 800.0000 mg | ORAL_TABLET | Freq: Three times a day (TID) | ORAL | 1 refills | Status: DC | PRN

## 2017-11-23 NOTE — Discharge Summary (Signed)
OB Discharge Summary     Patient Name: April Norris DOB: 11-25-1995 MRN: [id number]  Date of admission: 11/22/2017 Delivering MD: Hildred Laser   Date of discharge: 11/23/2017  Admitting diagnosis: Induction Intrauterine pregnancy: [redacted]w[redacted]d     Secondary diagnosis:  Active Problems:   Excessive weight gain during pregnancy in third trimester   Post-dates pregnancy   Late prenatal care affecting pregnancy in second trimester   Anemia of pregnancy in third trimester   Atonic postpartum hemorrhage  Additional problems: Obesity in pregnancy     Discharge diagnosis: Term Pregnancy Delivered, Anemia and PPH                                                                                                Post partum procedures:None  Augmentation: AROM  Complications: Postpartum hemorrhage (EBL 900 ml)  Hospital course:  Onset of Labor With Vaginal Delivery     22 y.o. yo [id number] at [redacted]w[redacted]d was admitted in Active Labor on 11/22/2017. Patient had an uncomplicated labor course as follows:  Membrane Rupture Time/Date: 7:43 AM ,11/22/2017   Intrapartum Procedures: Episiotomy: None [1]                                         Lacerations:  2nd degree [3]  Patient had a delivery of a Viable infant. 11/22/2017  Information for the patient's newborn:  Adelae, Yodice [[id number]]  Delivery Method: Vag-Spont    Pateint had an uncomplicated postpartum course.  She is ambulating, tolerating a regular diet, passing flatus, and urinating well. Patient is discharged home in stable condition on 11/23/17.   Physical exam  Vitals:   11/22/17 1505 11/22/17 1954 11/22/17 2356 11/23/17 0803  BP: 112/74 116/70 122/74 113/83  Pulse: 91 100 (!) 112 99  Resp: 18 20 20 18   Temp: (!) 97.5 F (36.4 C) 98.4 F (36.9 C) 98.1 F (36.7 C) 98.3 F (36.8 C)  TempSrc: Oral Oral Oral Oral  SpO2: 97% 97% 99% 100%  Weight:      Height:       General: alert and no distress Lochia: appropriate Uterine Fundus:  firm Incision: N/A DVT Evaluation: No evidence of DVT seen on physical exam. Negative Homan's sign. No cords or calf tenderness. No significant calf/ankle edema.   Labs: CBC Latest Ref Rng & Units 11/23/2017 11/22/2017 09/04/2017  WBC 3.6 - 11.0 K/uL 12.0(H) 8.9 9.7  Hemoglobin 12.0 - 16.0 g/dL 7.4(L) 10.1(L) 9.2(L)  Hematocrit 35.0 - 47.0 % 22.7(L) 31.2(L) 29.1(L)  Platelets 150 - 440 K/uL 253 323 339    Discharge instruction: per After Visit Summary   After visit meds:  Allergies as of 11/23/2017   No Known Allergies     Medication List    STOP taking these medications   ondansetron 4 MG disintegrating tablet Commonly known as:  ZOFRAN ODT     TAKE these medications   docusate sodium 100 MG capsule Commonly known as:  COLACE Take 1 capsule (100 mg total) by  mouth 2 (two) times daily as needed.   ferrous sulfate 325 (65 FE) MG tablet Commonly known as:  FERROUSUL Take 1 tablet (325 mg total) by mouth 2 (two) times daily.   ibuprofen 800 MG tablet Commonly known as:  ADVIL,MOTRIN Take 1 tablet (800 mg total) by mouth every 8 (eight) hours as needed.   prenatal multivitamin Tabs tablet Take 1 tablet by mouth daily at 12 noon.       Diet: routine diet  Activity: Advance as tolerated. Pelvic rest for 6 weeks.   Outpatient follow up:6 weeks Follow up Appt:No future appointments. Follow up Visit:No follow-ups on file.  Postpartum contraception: Nexplanon  Newborn Data: Live born female  Birth Weight: 8 lb 2.5 oz (3700 g) APGAR: 9, 10  Newborn Delivery   Birth date/time:  11/22/2017 10:17:00 Delivery type:  Vaginal, Spontaneous     Baby Feeding: Bottle Disposition:home with mother   11/23/2017 Hildred LaserAnika Angelissa Supan, MD

## 2017-11-23 NOTE — Discharge Instructions (Signed)
Vaginal Delivery, Care After °Refer to this sheet in the next few weeks. These instructions provide you with information about caring for yourself after vaginal delivery. Your health care provider may also give you more specific instructions. Your treatment has been planned according to current medical practices, but problems sometimes occur. Call your health care provider if you have any problems or questions. °What can I expect after the procedure? °After vaginal delivery, it is common to have: °· Some bleeding from your vagina. °· Soreness in your abdomen, your vagina, and the area of skin between your vaginal opening and your anus (perineum). °· Pelvic cramps. °· Fatigue. ° °Follow these instructions at home: °Medicines °· Take over-the-counter and prescription medicines only as told by your health care provider. °· If you were prescribed an antibiotic medicine, take it as told by your health care provider. Do not stop taking the antibiotic until it is finished. °Driving ° °· Do not drive or operate heavy machinery while taking prescription pain medicine. °· Do not drive for 24 hours if you received a sedative. °Lifestyle °· Do not drink alcohol. This is especially important if you are breastfeeding or taking medicine to relieve pain. °· Do not use tobacco products, including cigarettes, chewing tobacco, or e-cigarettes. If you need help quitting, ask your health care provider. °Eating and drinking °· Drink at least 8 eight-ounce glasses of water every day unless you are told not to by your health care provider. If you choose to breastfeed your baby, you may need to drink more water than this. °· Eat high-fiber foods every day. These foods may help prevent or relieve constipation. High-fiber foods include: °? Whole grain cereals and breads. °? Brown rice. °? Beans. °? Fresh fruits and vegetables. °Activity °· Return to your normal activities as told by your health care provider. Ask your health care provider  what activities are safe for you. °· Rest as much as possible. Try to rest or take a nap when your baby is sleeping. °· Do not lift anything that is heavier than your baby or 10 lb (4.5 kg) until your health care provider says that it is safe. °· Talk with your health care provider about when you can engage in sexual activity. This may depend on your: °? Risk of infection. °? Rate of healing. °? Comfort and desire to engage in sexual activity. °Vaginal Care °· If you have an episiotomy or a vaginal tear, check the area every day for signs of infection. Check for: °? More redness, swelling, or pain. °? More fluid or blood. °? Warmth. °? Pus or a bad smell. °· Do not use tampons or douches until your health care provider says this is safe. °· Watch for any blood clots that may pass from your vagina. These may look like clumps of dark red, brown, or black discharge. °General instructions °· Keep your perineum clean and dry as told by your health care provider. °· Wear loose, comfortable clothing. °· Wipe from front to back when you use the toilet. °· Ask your health care provider if you can shower or take a bath. If you had an episiotomy or a perineal tear during labor and delivery, your health care provider may tell you not to take baths for a certain length of time. °· Wear a bra that supports your breasts and fits you well. °· If possible, have someone help you with household activities and help care for your baby for at least a few days after   you leave the hospital.  Keep all follow-up visits for you and your baby as told by your health care provider. This is important. Contact a health care provider if:  You have: ? Vaginal discharge that has a bad smell. ? Difficulty urinating. ? Pain when urinating. ? A sudden increase or decrease in the frequency of your bowel movements. ? More redness, swelling, or pain around your episiotomy or vaginal tear. ? More fluid or blood coming from your episiotomy or  vaginal tear. ? Pus or a bad smell coming from your episiotomy or vaginal tear. ? A fever. ? A rash. ? Little or no interest in activities you used to enjoy. ? Questions about caring for yourself or your baby.  Your episiotomy or vaginal tear feels warm to the touch.  Your episiotomy or vaginal tear is separating or does not appear to be healing.  Your breasts are painful, hard, or turn red.  You feel unusually sad or worried.  You feel nauseous or you vomit.  You pass large blood clots from your vagina. If you pass a blood clot from your vagina, save it to show to your health care provider. Do not flush blood clots down the toilet without having your health care provider look at them.  You urinate more than usual.  You are dizzy or light-headed.  You have not breastfed at all and you have not had a menstrual period for 12 weeks after delivery.  You have stopped breastfeeding and you have not had a menstrual period for 12 weeks after you stopped breastfeeding. Get help right away if:  You have: ? Pain that does not go away or does not get better with medicine. ? Chest pain. ? Difficulty breathing. ? Blurred vision or spots in your vision. ? Thoughts about hurting yourself or your baby.  You develop pain in your abdomen or in one of your legs.  You develop a severe headache.  You faint.  You bleed from your vagina so much that you fill two sanitary pads in one hour. This information is not intended to replace advice given to you by your health care provider. Make sure you discuss any questions you have with your health care provider. Document Released: 05/04/2000 Document Revised: 10/19/2015 Document Reviewed: 05/22/2015 Elsevier Interactive Patient Education  2018 [employer]  No intercourse, tampons, or douching for 6 weeks.  No tub baths- showers only.  No driving for 2 weeks.  Continue prenatal vitamin and iron.

## 2017-11-23 NOTE — Progress Notes (Signed)
Post Partum Day # 1, s/p SVD, with postpartum hemorrhage (EBL 900 ml).  Subjective: no complaints, up ad lib, voiding and tolerating PO  Objective: Temp:  [97.5 F (36.4 C)-98.4 F (36.9 C)] 98.1 F (36.7 C) (07/05 2356) Pulse Rate:  [87-127] 112 (07/05 2356) Resp:  [18-20] 20 (07/05 2356) BP: (103-122)/(49-81) 122/74 (07/05 2356) SpO2:  [97 %-100 %] 99 % (07/05 2356)  Physical Exam:  General: cooperative and no distress  Lungs: clear to auscultation bilaterally Breasts: normal appearance, no masses or tenderness Heart: regular rate and rhythm, S1, S2 normal, no murmur, click, rub or gallop Abdomen: soft, non-tender; bowel sounds normal; no masses,  no organomegaly Pelvis: Lochia: appropriate, Uterine Fundus: firm Extremities: DVT Evaluation: No evidence of DVT seen on physical exam. Negative Homan's sign. No cords or calf tenderness. No significant calf/ankle edema.  Recent Labs    11/22/17 0705 11/23/17 0512  HGB 10.1* 7.4*  HCT 31.2* 22.7*    Assessment/Plan: Doing well overall postpartum Bottle (formula) feeding  Contraception Nexplanon Anemia of pregnancy worsened by postpartum hemorrhage. Currently asymptomatic. Will treat with PO iron supplementation BID.  Can d/c home today if desired.    LOS: 1 day   Hildred Laserherry, Naida Escalante, MD [facility] 11/23/2017 6:31 AM

## 2017-11-23 NOTE — Progress Notes (Signed)
Discharge instructions provided.  Pt and sig other verbalize understanding of all instructions and follow-up care.  Pt discharged to home with infant at 1320 on 11/23/17 via wheelchair by RN. Reynold BowenSusan Paisley Kip Cropp, RN 11/23/2017 1:35 PM

## 2017-11-23 NOTE — Plan of Care (Signed)
Vs stable; up ad lib; tolerating regular diet; taking motrin for pain control 

## 2018-01-09 ENCOUNTER — Ambulatory Visit (INDEPENDENT_AMBULATORY_CARE_PROVIDER_SITE_OTHER): Payer: Medicaid Other | Admitting: Obstetrics and Gynecology

## 2018-01-09 ENCOUNTER — Encounter: Payer: Self-pay | Admitting: Obstetrics and Gynecology

## 2018-01-09 DIAGNOSIS — O9081 Anemia of the puerperium: Secondary | ICD-10-CM

## 2018-01-09 NOTE — Progress Notes (Signed)
Pt is present today for Postpartum visit. Pt stated that she is doing well. Pt is not breastfeeding her baby nor pumping. Pt denies any sexually activities after birth. Pt would like ot have the Nexplanon as a form of birth control.  EPDS=0

## 2018-01-09 NOTE — Progress Notes (Signed)
   OBSTETRICS POSTPARTUM CLINIC PROGRESS NOTE  Subjective:     April Norris is a 22 y.o. 282P2002 female who presents for a postpartum visit. She is 6 weeks postpartum following a spontaneous vaginal delivery. I have fully reviewed the prenatal and intrapartum course. The delivery was at 41 gestational weeks.  Anesthesia: epidural. Postpartum course has been well. Baby's course has been well. Baby is feeding by bottle - Similac Advance. Bleeding: patient has not resumed menses, with No LMP recorded.. Bowel function is normal. Bladder function is normal. Patient is not sexually active. Contraception method desired is Nexplanon. Postpartum depression screening: negative.  The following portions of the patient's history were reviewed and updated as appropriate: allergies, current medications, past family history, past medical history, past social history, past surgical history and problem list.   Review of Systems Pertinent items noted in HPI and remainder of comprehensive ROS otherwise negative.   Objective:    BP 98/66   Pulse 96   Ht 5\' 3"  (1.6 m)   Wt 207 lb 9.6 oz (94.2 kg)   Breastfeeding? No   BMI 36.77 kg/m   General:  alert and no distress   Breasts:  inspection negative, no nipple discharge or bleeding, no masses or nodularity palpable  Lungs: clear to auscultation bilaterally  Heart:  regular rate and rhythm, S1, S2 normal, no murmur, click, rub or gallop  Abdomen: soft, non-tender; bowel sounds normal; no masses,  no organomegaly.     Vulva:  normal  Vagina: normal vagina, no discharge, exudate, lesion, or erythema  Cervix:  no cervical motion tenderness and no lesions  Corpus: normal size, contour, position, consistency, mobility, non-tender  Adnexa:  normal adnexa and no mass, fullness, tenderness  Rectal Exam: Not performed.         Labs:  Lab Results  Component Value Date   HGB 7.4 (L) 11/23/2017     Assessment:   Routine postpartum exam s/p SVD.  Postpartum  anemia   Plan:    1. Contraception: Nexplanon 2. Will check Hgb for h/o anemia.  3. Follow up in: 1 week for Nexplanon insertion, 6 months for annual exam.    Hildred Laserherry, Tayvon Culley, MD [facility]

## 2018-01-10 LAB — HEMOGLOBIN AND HEMATOCRIT, BLOOD
HEMATOCRIT: 32 % — AB (ref 34.0–46.6)
Hemoglobin: 9.9 g/dL — ABNORMAL LOW (ref 11.1–15.9)

## 2018-01-13 ENCOUNTER — Telehealth: Payer: Self-pay | Admitting: Obstetrics and Gynecology

## 2018-01-13 NOTE — Telephone Encounter (Signed)
Pt is aware of test results.  

## 2018-01-13 NOTE — Telephone Encounter (Signed)
The patient called and stated that she missed a call, and did not have a voicemail. Please advise.

## 2018-01-17 ENCOUNTER — Ambulatory Visit: Payer: Medicaid Other | Admitting: Obstetrics and Gynecology

## 2018-01-31 ENCOUNTER — Encounter: Payer: Self-pay | Admitting: Obstetrics and Gynecology

## 2018-01-31 ENCOUNTER — Ambulatory Visit (INDEPENDENT_AMBULATORY_CARE_PROVIDER_SITE_OTHER): Payer: Medicaid Other | Admitting: Obstetrics and Gynecology

## 2018-01-31 VITALS — BP 98/64 | HR 88 | Ht 63.0 in | Wt 207.6 lb

## 2018-01-31 DIAGNOSIS — Z3046 Encounter for surveillance of implantable subdermal contraceptive: Secondary | ICD-10-CM | POA: Diagnosis not present

## 2018-01-31 DIAGNOSIS — Z3202 Encounter for pregnancy test, result negative: Secondary | ICD-10-CM | POA: Diagnosis not present

## 2018-01-31 DIAGNOSIS — Z30017 Encounter for initial prescription of implantable subdermal contraceptive: Secondary | ICD-10-CM

## 2018-01-31 DIAGNOSIS — Z3049 Encounter for surveillance of other contraceptives: Secondary | ICD-10-CM | POA: Diagnosis not present

## 2018-01-31 LAB — POCT URINE PREGNANCY: PREG TEST UR: NEGATIVE

## 2018-01-31 NOTE — Progress Notes (Signed)
Pt is present today for nexplanon insertion. UPT-neg.  

## 2018-01-31 NOTE — Progress Notes (Signed)
     GYNECOLOGY OFFICE PROCEDURE NOTE  [name] is a 22 y.o. [id number] here for Nexplanon insertion. Patient has never had a pap smear. No other gynecologic concerns.  Nexplanon Insertion Procedure Patient identified, informed consent performed, consent signed.   Patient does understand that irregular bleeding is a very common side effect of this medication. She was advised to have backup contraception for one week after placement. Pregnancy test in clinic today was negative.  Appropriate time out taken.  Patient's left arm was prepped and draped in the usual sterile fashion. The ruler used to measure and mark insertion area.  Patient was prepped with alcohol swab and then injected with 3 ml of 1% lidocaine.  She was prepped with betadine, Nexplanon removed from packaging,  Device confirmed in needle, then inserted full length of needle and withdrawn per handbook instructions. Nexplanon was able to palpated in the patient's arm; patient palpated the insert herself. There was minimal blood loss.  Patient insertion site covered with guaze and a pressure bandage to reduce any bruising.  The patient tolerated the procedure well and was given post procedure instructions.    Exp: 06/10/2020 Lot: [id number]  Return to clinic for any scheduled appointments or for any gynecologic concerns as needed.     Hildred Laserherry, April Mettler, MD [facility]

## 2018-01-31 NOTE — Patient Instructions (Signed)
NEXPLANON PLACEMENT POST-PROCEDURE INSTRUCTIONS  1. You may take Ibuprofen, Aleve or Tylenol for pain if needed.  Pain should resolve within in 24 hours.  2. You may have intercourse after 24 hours.  If you using this for birth control, it is effective immediately.  3. You need to call if you have any fever, heavy bleeding, or redness at insertion site. Irregular bleeding is common the first several months after having a Nexplanonplaced. You do not need to call for this reason unless you are concerned.  4. Shower or bathe as normal.  You can remove the bandage after 24 hours.  5. Use back-up contraceptive method for 1 week

## 2018-03-26 ENCOUNTER — Encounter: Payer: Medicaid Other | Admitting: Obstetrics and Gynecology

## 2020-12-20 ENCOUNTER — Encounter: Payer: Self-pay | Admitting: Obstetrics and Gynecology

## 2020-12-20 ENCOUNTER — Ambulatory Visit (INDEPENDENT_AMBULATORY_CARE_PROVIDER_SITE_OTHER): Payer: Medicaid Other | Admitting: Obstetrics and Gynecology

## 2020-12-20 ENCOUNTER — Other Ambulatory Visit: Payer: Self-pay

## 2020-12-20 VITALS — BP 116/75 | HR 73 | Resp 16 | Ht 63.0 in | Wt 218.4 lb

## 2020-12-20 DIAGNOSIS — Z3202 Encounter for pregnancy test, result negative: Secondary | ICD-10-CM | POA: Diagnosis not present

## 2020-12-20 DIAGNOSIS — Z3046 Encounter for surveillance of implantable subdermal contraceptive: Secondary | ICD-10-CM

## 2020-12-20 DIAGNOSIS — N926 Irregular menstruation, unspecified: Secondary | ICD-10-CM

## 2020-12-20 LAB — POCT URINE PREGNANCY: Preg Test, Ur: NEGATIVE

## 2020-12-20 NOTE — Patient Instructions (Signed)
NEXPLANON PLACEMENT POST-PROCEDURE INSTRUCTIONS  You may take Ibuprofen, Aleve or Tylenol for pain if needed.  Pain should resolve within in 24 hours.  You may have intercourse after 24 hours.  If you using this for birth control, it is effective immediately.  You need to call if you have any fever, heavy bleeding, or redness at insertion site. Irregular bleeding is common the first several months after having a Nexplanonplaced. You do not need to call for this reason unless you are concerned.  Shower or bathe as normal.  You can remove the bandage after 24 hours.  

## 2020-12-20 NOTE — Progress Notes (Signed)
    GYNECOLOGY OFFICE PROCEDURE NOTE  April Norris is a 25 y.o. [id number] here for Nexplanon removal and re-insertion. Last Nexplanon inserted ~ 3 years ago.  Last pap smear was: patient has never had one.  Patient states she has not had her period for this month. She has concerns that she maybe pregnant.  Nexplanon Insertion Procedure Patient identified, informed consent performed, consent signed.   Patient does understand that irregular bleeding is a very common side effect of this medication. She was advised to have backup contraception for one week after placement. Pregnancy test in clinic today was negative.  Appropriate time out taken.  Patient's left arm was prepped and draped in the usual sterile fashion. The ruler used to measure and mark insertion area.  Patient was prepped with alcohol swab and then injected with 3 ml of 1% lidocaine.  She was prepped with betadine, Nexplanon removed from packaging,  Device confirmed in needle, then inserted full length of needle and withdrawn per handbook instructions. Nexplanon was able to palpated in the patient's arm; patient palpated the insert herself. There was minimal blood loss.  Patient insertion site covered with guaze and a pressure bandage to reduce any bruising.  The patient tolerated the procedure well and was given post procedure instructions.   Patient to follow up in 3-4 months for annual exam.    Lot: [id number] Exp: 11/21/2022  Hildred Laser, MD [facility]

## 2021-05-25 ENCOUNTER — Encounter: Payer: Medicaid Other | Admitting: Obstetrics and Gynecology

## 2021-06-22 ENCOUNTER — Other Ambulatory Visit: Payer: Self-pay

## 2021-06-22 ENCOUNTER — Other Ambulatory Visit (HOSPITAL_COMMUNITY)
Admission: RE | Admit: 2021-06-22 | Discharge: 2021-06-22 | Disposition: A | Discharge status: Home / Self Care | Payer: Medicaid Other | Source: Ambulatory Visit | Attending: Obstetrics and Gynecology | Admitting: Obstetrics and Gynecology

## 2021-06-22 ENCOUNTER — Encounter: Payer: Self-pay | Admitting: Obstetrics and Gynecology

## 2021-06-22 ENCOUNTER — Ambulatory Visit (INDEPENDENT_AMBULATORY_CARE_PROVIDER_SITE_OTHER): Payer: Medicaid Other | Admitting: Obstetrics and Gynecology

## 2021-06-22 VITALS — BP 110/66 | HR 78 | Resp 16 | Ht 63.0 in | Wt 214.0 lb

## 2021-06-22 DIAGNOSIS — Z1159 Encounter for screening for other viral diseases: Secondary | ICD-10-CM | POA: Diagnosis not present

## 2021-06-22 DIAGNOSIS — Z01419 Encounter for gynecological examination (general) (routine) without abnormal findings: Secondary | ICD-10-CM

## 2021-06-22 DIAGNOSIS — Z124 Encounter for screening for malignant neoplasm of cervix: Secondary | ICD-10-CM

## 2021-06-22 DIAGNOSIS — Z131 Encounter for screening for diabetes mellitus: Secondary | ICD-10-CM

## 2021-06-22 DIAGNOSIS — E669 Obesity, unspecified: Secondary | ICD-10-CM

## 2021-06-22 DIAGNOSIS — Z13220 Encounter for screening for lipoid disorders: Secondary | ICD-10-CM

## 2021-06-22 NOTE — Progress Notes (Signed)
GYNECOLOGY ANNUAL PHYSICAL EXAM PROGRESS NOTE  Subjective:    April Norris is a 26 y.o. G36P2002 female who presents for an annual exam. The patient has no complaints today. The patient is sexually active. The patient participates in regular exercise: no. Has the patient ever been transfused or tattooed?: no. The patient reports that there is not domestic violence in her life.    Menstrual History: Menarche age: 26 Patient's last menstrual period was 05/22/2021. Period Duration (Days): 5 Period Pattern: (!) Irregular Menstrual Flow: Moderate Menstrual Control: Maxi pad Menstrual Control Change Freq (Hours): 3-4 Dysmenorrhea: None   Gynecologic History:  Contraception: Nexplanon  (12/20/2020) History of STI's: Denies Last Pap: patient has never had one.      Upstream - 06/22/21 1104       Pregnancy Intention Screening   Does the patient want to become pregnant in the next year? No    Does the patient's partner want to become pregnant in the next year? No      Contraception Wrap Up   Current Method Hormonal Implant    End Method Hormonal Implant    Contraception Counseling Provided No            The pregnancy intention screening data noted above was reviewed. Potential methods of contraception were discussed. The patient elected to proceed with Hormonal Implant.     OB History  Gravida Para Term Preterm AB Living  2 2 2  0 0 2  SAB IAB Ectopic Multiple Live Births  0 0 0 0 2    # Outcome Date GA Lbr Len/2nd Weight Sex Delivery Anes PTL Lv  2 Term 11/22/17 [redacted]w[redacted]d  8 lb 2.5 oz (3.7 kg) F Vag-Spont None  LIV     Name: Schack,GIRL Sabena     Apgar1: 9  Apgar5: 10  1 Term 2017   6 lb 8 oz (2.948 kg) M Vag-Spont  N LIV    Past Medical History:  Diagnosis Date   Anemia     Past Surgical History:  Procedure Laterality Date   GANGLION CYST EXCISION Left 10/11/2014   Procedure: REMOVAL GANGLION OF WRIST;  Surgeon: Earnestine Leys, MD;  Location: [facility] ORS;   Service: Orthopedics;  Laterality: Left;    Family History  Problem Relation Age of Onset   Diabetes Maternal Grandmother    Hyperlipidemia Maternal Grandmother    Diabetes Paternal Grandmother    Cancer Paternal Grandfather        ?stomach related    Social History   Socioeconomic History   Marital status: Single    Spouse name: Not on file   Number of children: Not on file   Years of education: Not on file   Highest education level: Not on file  Occupational History   Occupation: student  Tobacco Use   Smoking status: Never   Smokeless tobacco: Never  Vaping Use   Vaping Use: Never used  Substance and Sexual Activity   Alcohol use: No    Alcohol/week: 0.0 standard drinks   Drug use: No   Sexual activity: Yes    Partners: Male    Birth control/protection: Condom  Other Topics Concern   Not on file  Social History Narrative   Not on file   Social Determinants of Health   Financial Resource Strain: Not on file  Food Insecurity: Not on file  Transportation Needs: Not on file  Physical Activity: Not on file  Stress: Not on file  Social Connections: Not on  file  Intimate Partner Violence: Not on file    Current Outpatient Medications on File Prior to Visit  Medication Sig Dispense Refill   ferrous sulfate (FERROUSUL) 325 (65 FE) MG tablet Take 1 tablet (325 mg total) by mouth 2 (two) times daily. 60 tablet 2   No current facility-administered medications on file prior to visit.    No Known Allergies   Review of Systems Constitutional: negative for chills, fatigue, fevers and sweats Eyes: negative for irritation, redness and visual disturbance Ears, nose, mouth, throat, and face: negative for hearing loss, nasal congestion, snoring and tinnitus Respiratory: negative for asthma, cough, sputum Cardiovascular: negative for chest pain, dyspnea, exertional chest pressure/discomfort, irregular heart beat, palpitations and syncope Gastrointestinal: negative for  abdominal pain, change in bowel habits, nausea and vomiting Genitourinary: negative for abnormal menstrual periods, genital lesions, sexual problems and vaginal discharge, dysuria and urinary incontinence Integument/breast: negative for breast lump, breast tenderness and nipple discharge Hematologic/lymphatic: negative for bleeding and easy bruising Musculoskeletal:negative for back pain and muscle weakness Neurological: negative for dizziness, headaches, vertigo and weakness Endocrine: negative for diabetic symptoms including polydipsia, polyuria and skin dryness Allergic/Immunologic: negative for hay fever and urticaria      Objective:  Blood pressure 110/66, pulse 78, resp. rate 16, height 5\' 3"  (1.6 m), weight 214 lb (97.1 kg), last menstrual period 05/22/2021.  Body mass index is 37.91 kg/m.    General Appearance:    Alert, cooperative, no distress, appears stated age, moderate obesity  Head:    Normocephalic, without obvious abnormality, atraumatic  Eyes:    PERRL, conjunctiva/corneas clear, EOM's intact, both eyes  Ears:    Normal external ear canals, both ears  Nose:   Nares normal, septum midline, mucosa normal, no drainage or sinus tenderness  Throat:   Lips, mucosa, and tongue normal; teeth and gums normal  Neck:   Supple, symmetrical, trachea midline, no adenopathy; thyroid: no enlargement/tenderness/nodules; no carotid bruit or JVD  Back:     Symmetric, no curvature, ROM normal, no CVA tenderness  Lungs:     Clear to auscultation bilaterally, respirations unlabored  Chest Wall:    No tenderness or deformity   Heart:    Regular rate and rhythm, S1 and S2 normal, no murmur, rub or gallop  Breast Exam:    No tenderness, masses, or nipple abnormality  Abdomen:     Soft, non-tender, bowel sounds active all four quadrants, no masses, no organomegaly.    Genitalia:    Pelvic:external genitalia normal, vagina without lesions, discharge, or tenderness, rectovaginal septum  normal.  Cervix normal in appearance, no cervical motion tenderness, no adnexal masses or tenderness.  Uterus normal size, shape, mobile, regular contours, nontender.  Rectal:    Normal external sphincter.  No hemorrhoids appreciated. Internal exam not done.   Extremities:   Extremities normal, atraumatic, no cyanosis or edema  Pulses:   2+ and symmetric all extremities  Skin:   Skin color, texture, turgor normal, no rashes or lesions  Lymph nodes:   Cervical, supraclavicular, and axillary nodes normal  Neurologic:   CNII-XII intact, normal strength, sensation and reflexes throughout   .  Labs:  Lab Results  Component Value Date   WBC 12.0 (H) 11/23/2017   HGB 9.9 (L) 01/09/2018   HCT 32.0 (L) 01/09/2018   MCV 79.5 (L) 11/23/2017   PLT 253 11/23/2017    No results found for: CREATININE, BUN, NA, K, CL, CO2  No results found for: ALT, AST, GGT, ALKPHOS, BILITOT  No results found for: TSH   Assessment:   1. Encounter for well woman exam with routine gynecological exam   2. Cervical cancer screening   3. Encounter for hepatitis C screening test for low risk patient   4. Screening for diabetes mellitus   5. Screening for lipid disorders   6. Obesity (BMI 35.0-39.9 without comorbidity)      Plan:  Blood tests: CBC with diff, Comprehensive metabolic panel, Lipoproteins, TSH, and Hemoglobin A1c. Breast self exam technique reviewed and patient encouraged to perform self-exam monthly. Contraception: Nexplanon. Discussed healthy lifestyle modifications. Pap smear ordered.  Gonorrhea/Chlamydia screening performed with pap following age-based screening recommendations. COVID vaccination status: declines Flu vaccine: declines Follow up in 1 year for annual exam   Rubie Maid, MD [facility]

## 2021-06-23 LAB — COMPREHENSIVE METABOLIC PANEL
ALT: 24 IU/L (ref 0–32)
AST: 15 IU/L (ref 0–40)
Albumin/Globulin Ratio: 1.4 (ref 1.2–2.2)
Albumin: 4.2 g/dL (ref 3.9–5.0)
Alkaline Phosphatase: 87 IU/L (ref 44–121)
BUN/Creatinine Ratio: 16 (ref 9–23)
BUN: 9 mg/dL (ref 6–20)
Bilirubin Total: 0.2 mg/dL (ref 0.0–1.2)
CO2: 21 mmol/L (ref 20–29)
Calcium: 9 mg/dL (ref 8.7–10.2)
Chloride: 104 mmol/L (ref 96–106)
Creatinine, Ser: 0.58 mg/dL (ref 0.57–1.00)
Globulin, Total: 2.9 g/dL (ref 1.5–4.5)
Glucose: 100 mg/dL — ABNORMAL HIGH (ref 70–99)
Potassium: 4.1 mmol/L (ref 3.5–5.2)
Sodium: 140 mmol/L (ref 134–144)
Total Protein: 7.1 g/dL (ref 6.0–8.5)
eGFR: 130 mL/min/{1.73_m2} (ref >59)

## 2021-06-23 LAB — CBC
Hematocrit: 37.6 % (ref 34.0–46.6)
Hemoglobin: 12.4 g/dL (ref 11.1–15.9)
MCH: 29.5 pg (ref 26.6–33.0)
MCHC: 33 g/dL (ref 31.5–35.7)
MCV: 89 fL (ref 79–97)
Platelets: 328 10*3/uL (ref 150–450)
RBC: 4.21 x10E6/uL (ref 3.77–5.28)
RDW: 12.6 % (ref 11.7–15.4)
WBC: 8.1 10*3/uL (ref 3.4–10.8)

## 2021-06-23 LAB — TSH: TSH: 1.32 u[IU]/mL (ref 0.450–4.500)

## 2021-06-23 LAB — LIPID PANEL
Chol/HDL Ratio: 3.3 ratio (ref 0.0–4.4)
Cholesterol, Total: 109 mg/dL (ref 100–199)
HDL: 33 mg/dL — ABNORMAL LOW (ref >39)
LDL Chol Calc (NIH): 60 mg/dL (ref 0–99)
Triglycerides: 78 mg/dL (ref 0–149)
VLDL Cholesterol Cal: 16 mg/dL (ref 5–40)

## 2021-06-23 LAB — HEMOGLOBIN A1C
Est. average glucose Bld gHb Est-mCnc: 111 mg/dL
Hgb A1c MFr Bld: 5.5 % (ref 4.8–5.6)

## 2021-06-23 LAB — HEPATITIS C ANTIBODY: Hep C Virus Ab: <0.1 s/co ratio (ref 0.0–0.9)

## 2021-06-26 LAB — CYTOLOGY - PAP
Chlamydia: NEGATIVE
Comment: NEGATIVE
Comment: NORMAL
Diagnosis: NEGATIVE
Neisseria Gonorrhea: NEGATIVE

## 2024-01-06 ENCOUNTER — Ambulatory Visit (INDEPENDENT_AMBULATORY_CARE_PROVIDER_SITE_OTHER): Admitting: Obstetrics & Gynecology

## 2024-01-06 VITALS — BP 100/81 | HR 73 | Ht 62.0 in | Wt 189.0 lb

## 2024-01-06 DIAGNOSIS — Z3046 Encounter for surveillance of implantable subdermal contraceptive: Secondary | ICD-10-CM | POA: Diagnosis not present

## 2024-01-06 DIAGNOSIS — Z30017 Encounter for initial prescription of implantable subdermal contraceptive: Secondary | ICD-10-CM

## 2024-01-06 NOTE — Progress Notes (Signed)
    GYNECOLOGY PROGRESS NOTE  Subjective:    Patient ID: April Norris, female    DOB: Mar 16, 1996, 28 y.o.   MRN: [id number]  HPI  Patient is a 28 y.o. [id number] here for removal of newly expiring Nexplanon  and replacement with new Nexplanon . She is happy with this form of contraception.  The following portions of the patient's history were reviewed and updated as appropriate: allergies, current medications, past family history, past medical history, past social history, past surgical history, and problem list.  Review of Systems Pertinent items are noted in HPI.  Monogamous for 11 years. She reports all normal paps and last one was 2023.  Objective:   Blood pressure 100/81, pulse 73, height 5' 2 (1.575 m), weight 189 lb (85.7 kg), last menstrual period 12/20/2023. Body mass index is 34.57 kg/m. Well nourished, well hydrated Latina, no apparent distress  Consent was signed and time out was done. Her left arm was prepped with betadine after establishing the position of the Nexplanon . I then sprayed the area with Hurricaine spray. The area was infiltrated with 2 cc of 1% lidocaine . A small incision was made and the intact rod was easily removed and noted to be intact.  I placed a new Nexplanon  according to standard of care through the same incision. A steristrip was placed and her arm was noted to be hemostatic. It was bandaged.  She tolerated the procedure well.    Assessment:   1. Encounter for removal of subdermal contraceptive implant   2. Insertion of Nexplanon       Plan:   1. Encounter for removal of subdermal contraceptive implant (Primary)   2. Insertion of Nexplanon   She will come back in a year for an annual exam and pap smear/prn sooner

## 2024-01-16 MED ORDER — ETONOGESTREL 68 MG ~~LOC~~ IMPL
68.0000 mg | DRUG_IMPLANT | Freq: Once | SUBCUTANEOUS | Status: AC
  Administered 2024-01-16: 68 mg via SUBCUTANEOUS

## 2024-01-16 NOTE — Addendum Note (Signed)
 Addended by: ALAINA WADDELL SAUNDERS on: 01/16/2024 09:08 AM   Modules accepted: Orders
